# Patient Record
Sex: Female | Born: 1935 | Hispanic: No | Smoking: Current every day smoker
Health system: Southern US, Community
[De-identification: ages and names within clinical notes are randomized; demographics above are authoritative.]

## PROBLEM LIST (undated history)

## (undated) DIAGNOSIS — E785 Hyperlipidemia, unspecified: Secondary | ICD-10-CM

## (undated) DIAGNOSIS — T8859XA Other complications of anesthesia, initial encounter: Secondary | ICD-10-CM

## (undated) DIAGNOSIS — Z8709 Personal history of other diseases of the respiratory system: Secondary | ICD-10-CM

## (undated) DIAGNOSIS — H3530 Unspecified macular degeneration: Secondary | ICD-10-CM

## (undated) DIAGNOSIS — H548 Legal blindness, as defined in USA: Secondary | ICD-10-CM

## (undated) DIAGNOSIS — F419 Anxiety disorder, unspecified: Secondary | ICD-10-CM

## (undated) DIAGNOSIS — I10 Essential (primary) hypertension: Secondary | ICD-10-CM

## (undated) DIAGNOSIS — N393 Stress incontinence (female) (male): Secondary | ICD-10-CM

## (undated) DIAGNOSIS — T4145XA Adverse effect of unspecified anesthetic, initial encounter: Secondary | ICD-10-CM

## (undated) HISTORY — PX: TONSILLECTOMY: SUR1361

## (undated) HISTORY — PX: CLAVICLE SURGERY: SHX598

## (undated) HISTORY — PX: APPENDECTOMY: SHX54

---

## 2000-06-26 ENCOUNTER — Other Ambulatory Visit: Admission: RE | Admit: 2000-06-26 | Discharge: 2000-06-26 | Payer: Self-pay | Admitting: Obstetrics & Gynecology

## 2001-03-16 ENCOUNTER — Encounter: Payer: Self-pay | Admitting: Orthopedic Surgery

## 2001-03-16 ENCOUNTER — Encounter: Payer: Self-pay | Admitting: Emergency Medicine

## 2001-03-16 ENCOUNTER — Inpatient Hospital Stay (HOSPITAL_COMMUNITY): Admission: EM | Admit: 2001-03-16 | Discharge: 2001-03-18 | Payer: Self-pay | Admitting: Emergency Medicine

## 2001-03-18 ENCOUNTER — Inpatient Hospital Stay (HOSPITAL_COMMUNITY)
Admission: RE | Admit: 2001-03-18 | Discharge: 2001-03-22 | Payer: Self-pay | Admitting: Physical Medicine & Rehabilitation

## 2001-03-20 ENCOUNTER — Encounter: Payer: Self-pay | Admitting: Physical Medicine & Rehabilitation

## 2001-09-12 ENCOUNTER — Encounter: Admission: RE | Admit: 2001-09-12 | Discharge: 2001-09-12 | Payer: Self-pay | Admitting: Geriatric Medicine

## 2001-09-12 ENCOUNTER — Encounter: Payer: Self-pay | Admitting: Geriatric Medicine

## 2001-09-18 ENCOUNTER — Encounter: Payer: Self-pay | Admitting: Geriatric Medicine

## 2001-09-18 ENCOUNTER — Encounter: Admission: RE | Admit: 2001-09-18 | Discharge: 2001-09-18 | Payer: Self-pay | Admitting: Geriatric Medicine

## 2001-10-20 ENCOUNTER — Encounter: Admission: RE | Admit: 2001-10-20 | Discharge: 2001-10-20 | Payer: Self-pay | Admitting: Geriatric Medicine

## 2001-10-20 ENCOUNTER — Encounter: Payer: Self-pay | Admitting: Geriatric Medicine

## 2002-10-14 ENCOUNTER — Encounter: Payer: Self-pay | Admitting: Geriatric Medicine

## 2002-10-14 ENCOUNTER — Encounter: Admission: RE | Admit: 2002-10-14 | Discharge: 2002-10-14 | Payer: Self-pay | Admitting: Geriatric Medicine

## 2004-01-30 HISTORY — PX: OTHER SURGICAL HISTORY: SHX169

## 2010-02-20 ENCOUNTER — Encounter: Payer: Self-pay | Admitting: Geriatric Medicine

## 2010-10-04 ENCOUNTER — Encounter (INDEPENDENT_AMBULATORY_CARE_PROVIDER_SITE_OTHER): Payer: Medicare Other | Admitting: Ophthalmology

## 2010-10-04 DIAGNOSIS — H2510 Age-related nuclear cataract, unspecified eye: Secondary | ICD-10-CM

## 2010-10-04 DIAGNOSIS — H353290 Exudative age-related macular degeneration, unspecified eye, stage unspecified: Secondary | ICD-10-CM

## 2010-10-04 DIAGNOSIS — H43819 Vitreous degeneration, unspecified eye: Secondary | ICD-10-CM

## 2010-10-04 DIAGNOSIS — H3530 Unspecified macular degeneration: Secondary | ICD-10-CM

## 2010-12-13 ENCOUNTER — Encounter (INDEPENDENT_AMBULATORY_CARE_PROVIDER_SITE_OTHER): Payer: Medicare Other | Admitting: Ophthalmology

## 2010-12-13 DIAGNOSIS — H353290 Exudative age-related macular degeneration, unspecified eye, stage unspecified: Secondary | ICD-10-CM

## 2010-12-13 DIAGNOSIS — H2510 Age-related nuclear cataract, unspecified eye: Secondary | ICD-10-CM

## 2010-12-13 DIAGNOSIS — H43819 Vitreous degeneration, unspecified eye: Secondary | ICD-10-CM

## 2010-12-13 DIAGNOSIS — H3530 Unspecified macular degeneration: Secondary | ICD-10-CM

## 2011-02-27 DIAGNOSIS — Z09 Encounter for follow-up examination after completed treatment for conditions other than malignant neoplasm: Secondary | ICD-10-CM | POA: Diagnosis not present

## 2011-02-27 DIAGNOSIS — Z79899 Other long term (current) drug therapy: Secondary | ICD-10-CM | POA: Diagnosis not present

## 2011-03-09 DIAGNOSIS — I129 Hypertensive chronic kidney disease with stage 1 through stage 4 chronic kidney disease, or unspecified chronic kidney disease: Secondary | ICD-10-CM | POA: Diagnosis not present

## 2011-03-09 DIAGNOSIS — Z23 Encounter for immunization: Secondary | ICD-10-CM | POA: Diagnosis not present

## 2011-03-09 DIAGNOSIS — Z0000 Encounter for general adult medical examination without abnormal findings: Secondary | ICD-10-CM | POA: Diagnosis not present

## 2011-03-09 DIAGNOSIS — N183 Chronic kidney disease, stage 3 unspecified: Secondary | ICD-10-CM | POA: Diagnosis not present

## 2011-03-14 ENCOUNTER — Encounter (INDEPENDENT_AMBULATORY_CARE_PROVIDER_SITE_OTHER): Payer: Medicare Other | Admitting: Ophthalmology

## 2011-03-14 DIAGNOSIS — H43819 Vitreous degeneration, unspecified eye: Secondary | ICD-10-CM

## 2011-03-14 DIAGNOSIS — H3530 Unspecified macular degeneration: Secondary | ICD-10-CM

## 2011-03-14 DIAGNOSIS — H353290 Exudative age-related macular degeneration, unspecified eye, stage unspecified: Secondary | ICD-10-CM

## 2011-03-14 DIAGNOSIS — H2510 Age-related nuclear cataract, unspecified eye: Secondary | ICD-10-CM | POA: Diagnosis not present

## 2011-03-16 DIAGNOSIS — C44319 Basal cell carcinoma of skin of other parts of face: Secondary | ICD-10-CM | POA: Diagnosis not present

## 2011-03-16 DIAGNOSIS — L980 Pyogenic granuloma: Secondary | ICD-10-CM | POA: Diagnosis not present

## 2011-03-16 DIAGNOSIS — L570 Actinic keratosis: Secondary | ICD-10-CM | POA: Diagnosis not present

## 2011-03-30 DIAGNOSIS — H2510 Age-related nuclear cataract, unspecified eye: Secondary | ICD-10-CM | POA: Diagnosis not present

## 2011-06-24 ENCOUNTER — Encounter (HOSPITAL_COMMUNITY): Payer: Self-pay | Admitting: *Deleted

## 2011-06-24 ENCOUNTER — Emergency Department (HOSPITAL_COMMUNITY)
Admission: EM | Admit: 2011-06-24 | Discharge: 2011-06-24 | Disposition: A | Discharge status: Home / Self Care | Payer: Medicare Other | Attending: Emergency Medicine | Admitting: Emergency Medicine

## 2011-06-24 ENCOUNTER — Emergency Department (HOSPITAL_COMMUNITY): Payer: Medicare Other

## 2011-06-24 DIAGNOSIS — R002 Palpitations: Secondary | ICD-10-CM | POA: Insufficient documentation

## 2011-06-24 DIAGNOSIS — R42 Dizziness and giddiness: Secondary | ICD-10-CM | POA: Diagnosis not present

## 2011-06-24 DIAGNOSIS — R110 Nausea: Secondary | ICD-10-CM | POA: Insufficient documentation

## 2011-06-24 DIAGNOSIS — R209 Unspecified disturbances of skin sensation: Secondary | ICD-10-CM | POA: Diagnosis not present

## 2011-06-24 DIAGNOSIS — I10 Essential (primary) hypertension: Secondary | ICD-10-CM | POA: Insufficient documentation

## 2011-06-24 DIAGNOSIS — R51 Headache: Secondary | ICD-10-CM | POA: Diagnosis not present

## 2011-06-24 DIAGNOSIS — R159 Full incontinence of feces: Secondary | ICD-10-CM | POA: Diagnosis not present

## 2011-06-24 DIAGNOSIS — R61 Generalized hyperhidrosis: Secondary | ICD-10-CM | POA: Insufficient documentation

## 2011-06-24 DIAGNOSIS — R55 Syncope and collapse: Secondary | ICD-10-CM | POA: Diagnosis not present

## 2011-06-24 DIAGNOSIS — G319 Degenerative disease of nervous system, unspecified: Secondary | ICD-10-CM | POA: Diagnosis not present

## 2011-06-24 DIAGNOSIS — R32 Unspecified urinary incontinence: Secondary | ICD-10-CM | POA: Insufficient documentation

## 2011-06-24 DIAGNOSIS — J322 Chronic ethmoidal sinusitis: Secondary | ICD-10-CM | POA: Diagnosis not present

## 2011-06-24 HISTORY — DX: Unspecified macular degeneration: H35.30

## 2011-06-24 HISTORY — DX: Essential (primary) hypertension: I10

## 2011-06-24 LAB — COMPREHENSIVE METABOLIC PANEL
ALT: 15 U/L (ref 0–35)
AST: 22 U/L (ref 0–37)
Albumin: 3.8 g/dL (ref 3.5–5.2)
Alkaline Phosphatase: 52 U/L (ref 39–117)
BUN: 22 mg/dL (ref 6–23)
CO2: 25 mEq/L (ref 19–32)
Calcium: 9.9 mg/dL (ref 8.4–10.5)
Chloride: 104 mEq/L (ref 96–112)
Creatinine, Ser: 1.17 mg/dL — ABNORMAL HIGH (ref 0.50–1.10)
GFR calc Af Amer: 47 mL/min — ABNORMAL LOW (ref >90)
GFR calc non Af Amer: 40 mL/min — ABNORMAL LOW (ref >90)
Glucose, Bld: 103 mg/dL — ABNORMAL HIGH (ref 70–99)
Potassium: 3.5 mEq/L (ref 3.5–5.1)
Sodium: 142 mEq/L (ref 135–145)
Total Bilirubin: 0.6 mg/dL (ref 0.3–1.2)
Total Protein: 6.6 g/dL (ref 6.0–8.3)

## 2011-06-24 LAB — DIFFERENTIAL
Basophils Absolute: 0 10*3/uL (ref 0.0–0.1)
Basophils Relative: 1 % (ref 0–1)
Eosinophils Absolute: 0.2 10*3/uL (ref 0.0–0.7)
Eosinophils Relative: 2 % (ref 0–5)
Lymphocytes Relative: 23 % (ref 12–46)
Lymphs Abs: 1.9 10*3/uL (ref 0.7–4.0)
Monocytes Absolute: 0.8 10*3/uL (ref 0.1–1.0)
Monocytes Relative: 9 % (ref 3–12)
Neutro Abs: 5.4 10*3/uL (ref 1.7–7.7)
Neutrophils Relative %: 65 % (ref 43–77)

## 2011-06-24 LAB — CBC
HCT: 40.1 % (ref 36.0–46.0)
Hemoglobin: 13.1 g/dL (ref 12.0–15.0)
MCH: 28.4 pg (ref 26.0–34.0)
MCHC: 32.7 g/dL (ref 30.0–36.0)
MCV: 86.8 fL (ref 78.0–100.0)
Platelets: 241 10*3/uL (ref 150–400)
RBC: 4.62 MIL/uL (ref 3.87–5.11)
RDW: 14.1 % (ref 11.5–15.5)
WBC: 8.2 10*3/uL (ref 4.0–10.5)

## 2011-06-24 LAB — URINE MICROSCOPIC-ADD ON

## 2011-06-24 LAB — URINALYSIS, ROUTINE W REFLEX MICROSCOPIC
Bilirubin Urine: NEGATIVE
Glucose, UA: NEGATIVE mg/dL
Hgb urine dipstick: NEGATIVE
Ketones, ur: NEGATIVE mg/dL
Nitrite: NEGATIVE
Protein, ur: NEGATIVE mg/dL
Specific Gravity, Urine: 1.015 (ref 1.005–1.030)
Urobilinogen, UA: 0.2 mg/dL (ref 0.0–1.0)
pH: 7.5 (ref 5.0–8.0)

## 2011-06-24 LAB — POCT I-STAT TROPONIN I: Troponin i, poc: 0.01 ng/mL (ref 0.00–0.08)

## 2011-06-24 LAB — PROTIME-INR
INR: 0.96 (ref 0.00–1.49)
Prothrombin Time: 13 seconds (ref 11.6–15.2)

## 2011-06-24 LAB — APTT: aPTT: 28 seconds (ref 24–37)

## 2011-06-24 NOTE — ED Notes (Signed)
Pt ambulated to bathroom.  Tolerated well.  Steady gait.

## 2011-06-24 NOTE — ED Notes (Signed)
Pt reports having a <2 week continuous episode of 'head feeling funny', denies HA or pressure.  Pt reports increased weakness and one episode of bowel incontinence this am.  Denies fall.  Reports that she also had an episode of palpitations, with diaphoresis.  Denies N/V/SOB.  Non-tender on palpation of chest or abdomen.  Denies palpitations or pain at this time.  Family at bedside is concerned for increased anxiety.  Will continue to monitor.

## 2011-06-24 NOTE — ED Provider Notes (Cosign Needed)
History     CSN: [id number]  Arrival date & time 06/24/11  4098   First MD Initiated Contact with Patient 06/24/11 [phone]      Chief Complaint  Patient presents with  . Headache  . Palpitations    (Consider location/radiation/quality/duration/timing/severity/associated sxs/prior treatment) HPI Comments: Patient is an 78 year old woman who said that her head is been feeling strange recently. This morning she woke up with a fluttering in her chest. She went to the kitchen and was preparing breakfast. She took her morning medications. All of a sudden her head felt strange. He felt she had to lie down. She broke out in a profuse sweat, and on the way back to bed was incontinent of urine and stool. She had no prior similar episode. Her past history is noteworthy for hypertension and macular degeneration.  Patient is a 78 y.o. female presenting with neurologic complaint. The history is provided by the patient. No language interpreter was used.  Neurologic Problem The primary symptoms include dizziness and nausea. Primary symptoms do not include fever or vomiting. The symptoms began 2 to 6 hours ago. The episode lasted 30 minutes. The symptoms are improving. The neurological symptoms are diffuse.  Dizziness also occurs with nausea. Dizziness does not occur with vomiting.  Medical issues also include hypertension. Workup history includes MRI.    Past Medical History  Diagnosis Date  . Macular degeneration   . Hypertension   . UTI (urinary tract infection)     History reviewed. No pertinent past surgical history.  History reviewed. No pertinent family history.  History  Substance Use Topics  . Smoking status: Current Everyday Smoker  . Smokeless tobacco: Not on file  . Alcohol Use: Yes     occ    OB History    Grav Para Term Preterm Abortions TAB SAB Ect Mult Living                  Review of Systems  Constitutional: Negative.  Negative for fever and chills.  HENT: Negative.    Eyes: Negative.   Respiratory: Negative.   Cardiovascular: Positive for palpitations.  Gastrointestinal: Positive for nausea. Negative for vomiting.  Genitourinary:       Incontinence   Musculoskeletal: Negative.   Skin:       Profuse sweating.  Neurological: Positive for dizziness.  Psychiatric/Behavioral: Negative.     Allergies  Ivp dye and Sulfa antibiotics  Home Medications  No current outpatient prescriptions on file.  BP 123/52  Pulse 57  Temp(Src) 98.1 F (36.7 C) (Oral)  Resp 16  SpO2 96%  Physical Exam  Nursing note and vitals reviewed. Constitutional: She is oriented to person, place, and time.       Pleasant elderly lady in no distress.  HENT:  Head: Normocephalic and atraumatic.  Right Ear: External ear normal.  Left Ear: External ear normal.  Mouth/Throat: Oropharynx is clear and moist.  Eyes: Conjunctivae and EOM are normal. Pupils are equal, round, and reactive to light.  Neck: Normal range of motion. Neck supple.  Cardiovascular: Normal rate, regular rhythm and normal heart sounds.   Pulmonary/Chest: Effort normal and breath sounds normal.  Abdominal: Soft. Bowel sounds are normal.  Musculoskeletal: Normal range of motion. She exhibits no edema and no tenderness.  Neurological: She is alert and oriented to person, place, and time.       No sensory or motor deficit.  Skin: Skin is warm and dry.  Psychiatric: She has a normal mood and  affect. Her behavior is normal.    ED Course  Procedures (including critical care time)  9:58 AM  Date: 06/24/2011  Rate: 56  Rhythm: sinus bradycardia  QRS Axis: normal  Intervals: normal  ST/T Wave abnormalities: normal  Conduction Disutrbances:none  Narrative Interpretation: Normal EKG  Old EKG Reviewed: unchanged  11:07 AM Pt seen --> physical exam performed.  EKG benign.  Lab workup ordered.  2:54 PM Results for orders placed during the hospital encounter of 06/24/11  CBC      Component Value  Range   WBC 8.2  4.0 - 10.5 (K/uL)   RBC 4.62  3.87 - 5.11 (MIL/uL)   Hemoglobin 13.1  12.0 - 15.0 (g/dL)   HCT 91.4  78.2 - 95.6 (%)   MCV 86.8  78.0 - 100.0 (fL)   MCH 28.4  26.0 - 34.0 (pg)   MCHC 32.7  30.0 - 36.0 (g/dL)   RDW 21.3  08.6 - 57.8 (%)   Platelets 241  150 - 400 (K/uL)  DIFFERENTIAL      Component Value Range   Neutrophils Relative 65  43 - 77 (%)   Neutro Abs 5.4  1.7 - 7.7 (K/uL)   Lymphocytes Relative 23  12 - 46 (%)   Lymphs Abs 1.9  0.7 - 4.0 (K/uL)   Monocytes Relative 9  3 - 12 (%)   Monocytes Absolute 0.8  0.1 - 1.0 (K/uL)   Eosinophils Relative 2  0 - 5 (%)   Eosinophils Absolute 0.2  0.0 - 0.7 (K/uL)   Basophils Relative 1  0 - 1 (%)   Basophils Absolute 0.0  0.0 - 0.1 (K/uL)  COMPREHENSIVE METABOLIC PANEL      Component Value Range   Sodium 142  135 - 145 (mEq/L)   Potassium 3.5  3.5 - 5.1 (mEq/L)   Chloride 104  96 - 112 (mEq/L)   CO2 25  19 - 32 (mEq/L)   Glucose, Bld 103 (*) 70 - 99 (mg/dL)   BUN 22  6 - 23 (mg/dL)   Creatinine, Ser 4.69 (*) 0.50 - 1.10 (mg/dL)   Calcium 9.9  8.4 - 62.9 (mg/dL)   Total Protein 6.6  6.0 - 8.3 (g/dL)   Albumin 3.8  3.5 - 5.2 (g/dL)   AST 22  0 - 37 (U/L)   ALT 15  0 - 35 (U/L)   Alkaline Phosphatase 52  39 - 117 (U/L)   Total Bilirubin 0.6  0.3 - 1.2 (mg/dL)   GFR calc non Af Amer 40 (*) >90 (mL/min)   GFR calc Af Amer 47 (*) >90 (mL/min)  URINALYSIS, ROUTINE W REFLEX MICROSCOPIC      Component Value Range   Color, Urine YELLOW  YELLOW    APPearance CLEAR  CLEAR    Specific Gravity, Urine 1.015  1.005 - 1.030    pH 7.5  5.0 - 8.0    Glucose, UA NEGATIVE  NEGATIVE (mg/dL)   Hgb urine dipstick NEGATIVE  NEGATIVE    Bilirubin Urine NEGATIVE  NEGATIVE    Ketones, ur NEGATIVE  NEGATIVE (mg/dL)   Protein, ur NEGATIVE  NEGATIVE (mg/dL)   Urobilinogen, UA 0.2  0.0 - 1.0 (mg/dL)   Nitrite NEGATIVE  NEGATIVE    Leukocytes, UA TRACE (*) NEGATIVE   PROTIME-INR      Component Value Range   Prothrombin Time 13.0   11.6 - 15.2 (seconds)   INR 0.96  0.00 - 1.49   APTT  Component Value Range   aPTT 28  24 - 37 (seconds)  URINE MICROSCOPIC-ADD ON      Component Value Range   Squamous Epithelial / LPF MANY (*) RARE    WBC, UA 0-2  <3 (WBC/hpf)   Bacteria, UA MANY (*) RARE   POCT I-STAT TROPONIN I      Component Value Range   Troponin i, poc 0.01  0.00 - 0.08 (ng/mL)   Comment 3            Ct Head Wo Contrast  06/24/2011  *RADIOLOGY REPORT*  Clinical Data: Feeling funny.  Incontinence.  History of mini strokes.  CT HEAD WITHOUT CONTRAST  Technique:  Contiguous axial images were obtained from the base of the skull through the vertex without contrast.  Comparison: None  Findings: There is no intra or extra-axial fluid collection or mass lesion.  The basilar cisterns and ventricles have a normal appearance.  There is no CT evidence for acute infarction or hemorrhage.  Bone windows show no calvarial fracture.  There is mucoperiosteal thickening of the ethmoid sinuses.  Internal carotid calcification is present.  IMPRESSION:  1. No evidence for acute intracranial abnormality. 2.  Changes of chronic ethmoid sinusitis.  Original Report Authenticated By: Patterson Hammersmith, M.D.    Lab tests and CT of head negative.  MRI of brain ordered.  Results discussed with pt.    4:00 PM Pt is on MRI table now.  4:44 PM MRI of brain shows age-related change, no stroke or mass or hemorrhage.  Pt advised that her tests were good, and that it is safe to go home.  No specific cause of her symptoms was found, but no serious cause was found either.  1. Near syncope         Carleene Cooper III, MD 06/25/11 [phone]

## 2011-06-24 NOTE — ED Notes (Signed)
Family member at nurses desk.  Reports that Dr. Ignacia Palma told him one hour ago that pt was going to be discharged.  Reports that

## 2011-06-24 NOTE — ED Notes (Signed)
Reports her "head feeling funny for several days" thought it may be due to her bp. Woke up this am with headache and was having palpitations last night. Had episode this am of diaphoresis and was incontinent of her bowels. No neuro deficits noted at triage, ekg done. resp e/u. skn w/d.

## 2011-06-24 NOTE — Discharge Instructions (Signed)
Karen Calhoun, you had physical examination, electrocardiogram, laboratory tests, CT x-ray the brain, an MRI of the brain to check on you after you had an episode where her head felt funny, he became very sweaty and became incontinent. Fortunately, all your tests came out normal for a person your age. No particular explanation was found for this episode; on the other hand, none of this serious disease that can cause an episode like this was found. It is safe to go home and to continue your regular medications. A report of your visit will be sent to Merlene Laughter M.D., your internist.

## 2011-06-26 LAB — URINE CULTURE
Colony Count: >=100000
Culture  Setup Time: 201305261659

## 2011-06-27 ENCOUNTER — Encounter (INDEPENDENT_AMBULATORY_CARE_PROVIDER_SITE_OTHER): Payer: Medicare Other | Admitting: Ophthalmology

## 2011-06-27 DIAGNOSIS — H43819 Vitreous degeneration, unspecified eye: Secondary | ICD-10-CM

## 2011-06-27 DIAGNOSIS — H353290 Exudative age-related macular degeneration, unspecified eye, stage unspecified: Secondary | ICD-10-CM | POA: Diagnosis not present

## 2011-06-27 DIAGNOSIS — H2510 Age-related nuclear cataract, unspecified eye: Secondary | ICD-10-CM | POA: Diagnosis not present

## 2011-06-27 DIAGNOSIS — H3530 Unspecified macular degeneration: Secondary | ICD-10-CM

## 2011-06-27 NOTE — ED Notes (Signed)
+   Urine Chart sent to EDP office for review. 

## 2011-06-28 NOTE — ED Notes (Signed)
Prescription called in to [facility] at [phone] for keflex 500mg  po bid x7 days; pt informed.

## 2011-06-28 NOTE — ED Notes (Signed)
Chart returned from EDP office. Prescribed Keflex 500 BID x 7 days. Follow-up with PCP. Prescribed/reviewed by Jaci Carrel PA-C.

## 2011-06-28 NOTE — ED Notes (Signed)
Called patient and informed them of new medication. Wants RX called to [facility] on [address]. RX called in by Otilio Carpen P[facility].

## 2011-08-15 ENCOUNTER — Encounter (INDEPENDENT_AMBULATORY_CARE_PROVIDER_SITE_OTHER): Payer: Medicare Other | Admitting: Ophthalmology

## 2011-08-15 DIAGNOSIS — H2510 Age-related nuclear cataract, unspecified eye: Secondary | ICD-10-CM

## 2011-08-15 DIAGNOSIS — H43819 Vitreous degeneration, unspecified eye: Secondary | ICD-10-CM | POA: Diagnosis not present

## 2011-08-15 DIAGNOSIS — H3530 Unspecified macular degeneration: Secondary | ICD-10-CM | POA: Diagnosis not present

## 2011-08-24 DIAGNOSIS — R32 Unspecified urinary incontinence: Secondary | ICD-10-CM | POA: Diagnosis not present

## 2011-09-06 DIAGNOSIS — R5381 Other malaise: Secondary | ICD-10-CM | POA: Diagnosis not present

## 2011-09-06 DIAGNOSIS — I129 Hypertensive chronic kidney disease with stage 1 through stage 4 chronic kidney disease, or unspecified chronic kidney disease: Secondary | ICD-10-CM | POA: Diagnosis not present

## 2011-09-06 DIAGNOSIS — R5383 Other fatigue: Secondary | ICD-10-CM | POA: Diagnosis not present

## 2011-09-06 DIAGNOSIS — N390 Urinary tract infection, site not specified: Secondary | ICD-10-CM | POA: Diagnosis not present

## 2011-09-06 DIAGNOSIS — N183 Chronic kidney disease, stage 3 unspecified: Secondary | ICD-10-CM | POA: Diagnosis not present

## 2011-09-06 DIAGNOSIS — E782 Mixed hyperlipidemia: Secondary | ICD-10-CM | POA: Diagnosis not present

## 2011-09-18 DIAGNOSIS — H2510 Age-related nuclear cataract, unspecified eye: Secondary | ICD-10-CM | POA: Diagnosis not present

## 2011-09-18 DIAGNOSIS — H3530 Unspecified macular degeneration: Secondary | ICD-10-CM | POA: Diagnosis not present

## 2011-10-10 ENCOUNTER — Encounter (INDEPENDENT_AMBULATORY_CARE_PROVIDER_SITE_OTHER): Payer: Medicare Other | Admitting: Ophthalmology

## 2011-10-10 DIAGNOSIS — H3530 Unspecified macular degeneration: Secondary | ICD-10-CM

## 2011-10-10 DIAGNOSIS — H43819 Vitreous degeneration, unspecified eye: Secondary | ICD-10-CM | POA: Diagnosis not present

## 2011-10-10 DIAGNOSIS — I10 Essential (primary) hypertension: Secondary | ICD-10-CM

## 2011-10-10 DIAGNOSIS — H35039 Hypertensive retinopathy, unspecified eye: Secondary | ICD-10-CM | POA: Diagnosis not present

## 2011-10-10 DIAGNOSIS — H2510 Age-related nuclear cataract, unspecified eye: Secondary | ICD-10-CM

## 2011-10-17 DIAGNOSIS — E782 Mixed hyperlipidemia: Secondary | ICD-10-CM | POA: Diagnosis not present

## 2011-10-17 DIAGNOSIS — R5381 Other malaise: Secondary | ICD-10-CM | POA: Diagnosis not present

## 2011-10-17 DIAGNOSIS — N183 Chronic kidney disease, stage 3 unspecified: Secondary | ICD-10-CM | POA: Diagnosis not present

## 2011-10-17 DIAGNOSIS — I129 Hypertensive chronic kidney disease with stage 1 through stage 4 chronic kidney disease, or unspecified chronic kidney disease: Secondary | ICD-10-CM | POA: Diagnosis not present

## 2011-11-06 DIAGNOSIS — Z23 Encounter for immunization: Secondary | ICD-10-CM | POA: Diagnosis not present

## 2011-11-21 DIAGNOSIS — L570 Actinic keratosis: Secondary | ICD-10-CM | POA: Diagnosis not present

## 2011-11-26 ENCOUNTER — Emergency Department (INDEPENDENT_AMBULATORY_CARE_PROVIDER_SITE_OTHER)
Admission: EM | Admit: 2011-11-26 | Discharge: 2011-11-26 | Disposition: A | Discharge status: Home / Self Care | Payer: Medicare Other | Source: Home / Self Care

## 2011-11-26 ENCOUNTER — Emergency Department (INDEPENDENT_AMBULATORY_CARE_PROVIDER_SITE_OTHER): Payer: Medicare Other

## 2011-11-26 ENCOUNTER — Encounter (HOSPITAL_COMMUNITY): Payer: Self-pay | Admitting: Emergency Medicine

## 2011-11-26 DIAGNOSIS — IMO0002 Reserved for concepts with insufficient information to code with codable children: Secondary | ICD-10-CM | POA: Diagnosis not present

## 2011-11-26 DIAGNOSIS — S20219A Contusion of unspecified front wall of thorax, initial encounter: Secondary | ICD-10-CM

## 2011-11-26 DIAGNOSIS — W19XXXA Unspecified fall, initial encounter: Secondary | ICD-10-CM

## 2011-11-26 DIAGNOSIS — S00511A Abrasion of lip, initial encounter: Secondary | ICD-10-CM

## 2011-11-26 DIAGNOSIS — Y92009 Unspecified place in unspecified non-institutional (private) residence as the place of occurrence of the external cause: Secondary | ICD-10-CM

## 2011-11-26 DIAGNOSIS — J9819 Other pulmonary collapse: Secondary | ICD-10-CM | POA: Diagnosis not present

## 2011-11-26 DIAGNOSIS — R079 Chest pain, unspecified: Secondary | ICD-10-CM | POA: Diagnosis not present

## 2011-11-26 NOTE — ED Provider Notes (Signed)
History     CSN: [id number]  Arrival date & time 11/26/11  0948   None     Chief Complaint  Patient presents with  . Fall    (Consider location/radiation/quality/duration/timing/severity/associated sxs/prior treatment) HPI Comments: 78 year old female who was walking with a cane solely for the purpose of helping her not fall axially tripped over the cane last night. She fell forward and struck her face on the floor and also her anterior chest. She states that she had bleeding from the lip that lasted for a couple of hours. But since has stopped. She's complaining of pain in the right anterior and bilateral ribs. She denies injury to the head neck back abdomen pelvis, lower or upper extremities. She is fully alert and oriented with appropriate speech content and quite loquacious.  Patient is a 78 y.o. female presenting with fall.  Fall Pertinent negatives include no fever.    Past Medical History  Diagnosis Date  . Macular degeneration   . Hypertension   . UTI (urinary tract infection)   . High cholesterol     Past Surgical History  Procedure Date  . Tonsillectomy   . Appendectomy   . Ankle fracture surgery     No family history on file.  History  Substance Use Topics  . Smoking status: Current Every Day Smoker  . Smokeless tobacco: Not on file  . Alcohol Use: Yes     occ    OB History    Grav Para Term Preterm Abortions TAB SAB Ect Mult Living                  Review of Systems  Constitutional: Negative for fever, chills and activity change.  HENT: Positive for facial swelling, rhinorrhea and postnasal drip. Negative for ear pain, nosebleeds, congestion, drooling, trouble swallowing, neck pain, neck stiffness, dental problem, voice change and ear discharge.   Respiratory: Negative.  Negative for cough, choking, chest tightness, shortness of breath, wheezing and stridor.   Cardiovascular: Negative.   Gastrointestinal: Negative.   Genitourinary: Negative.     Musculoskeletal: Negative for back pain, joint swelling and arthralgias.       As per HPI  Skin: Positive for wound. Negative for color change, pallor and rash.  Neurological: Negative.   Psychiatric/Behavioral: Negative.     Allergies  Codeine; Ivp dye; Oxycodone hcl er; and Sulfa antibiotics  Home Medications   Current Outpatient Rx  Name Route Sig Dispense Refill  . ACETAMINOPHEN 325 MG PO TABS Oral Take 650 mg by mouth every 6 (six) hours as needed. Headache or pain    . ASPIRIN EC 81 MG PO TBEC Oral Take 81 mg by mouth daily.    . ATENOLOL 50 MG PO TABS Oral Take 50 mg by mouth daily.    . ATORVASTATIN CALCIUM 10 MG PO TABS Oral Take 10 mg by mouth daily.    Marland Kitchen CALCIUM CITRATE-VITAMIN D 315-200 MG-UNIT PO TABS Oral Take 1 tablet by mouth daily.    Marland Kitchen DIPHENHYDRAMINE-APAP (SLEEP) 25-500 MG PO TABS Oral Take 1 tablet by mouth at bedtime as needed. sleep    . HYDRALAZINE HCL 25 MG PO TABS Oral Take 25 mg by mouth 2 (two) times daily.    Marland Kitchen HYDROCHLOROTHIAZIDE 25 MG PO TABS Oral Take 25 mg by mouth daily.    Marland Kitchen MAGNESIUM 250 MG PO TABS Oral Take 1 tablet by mouth daily.    Marland Kitchen MOXIFLOXACIN HCL 0.5 % OP SOLN Both Eyes Place 1 drop  into both eyes 3 (three) times daily. For ophthalmologist appointment every approximately 3 months    . ADULT MULTIVITAMIN W/MINERALS CH Oral Take 1 tablet by mouth daily.    Marland Kitchen POTASSIUM CHLORIDE CRYS ER 20 MEQ PO TBCR Oral Take 20 mEq by mouth daily.    Marland Kitchen VITAMIN D (CHOLECALCIFEROL) PO Oral Take 1 tablet by mouth daily.      BP 168/66  Pulse 55  Temp 97.9 F (36.6 C) (Oral)  Resp 18  SpO2 96%  Physical Exam  Constitutional: She is oriented to person, place, and time. She appears well-developed and well-nourished. No distress.  HENT:  Head: Normocephalic and atraumatic.  Mouth/Throat: No oropharyngeal exudate.       TMs pearly gray transparent without hemotympanums, effusion, erythema, or retractions. OP is clear with exception of scant PND  Eyes: EOM  are normal. Pupils are equal, round, and reactive to light. Right eye exhibits no discharge. Left eye exhibits no discharge.  Neck: Normal range of motion. Neck supple.  Cardiovascular: Normal rate, regular rhythm and normal heart sounds.   Pulmonary/Chest: Effort normal and breath sounds normal. No respiratory distress. She has no wheezes.       There is tenderness over the right upper anterior chest wall bilateral ribs particularly the left costal margins.  Abdominal: Soft. She exhibits no distension. There is no tenderness.  Musculoskeletal: She exhibits tenderness. She exhibits no edema.       There is a superficial 2 mm abrasion to the upper mid lip. There are also abrasions beneath the upper at on the mucosal side. No lacerations. No dental tenderness at all her teeth are intact. The tongue, buccal wall is an oropharynx are otherwise intact. There is slight, minor bruising to the nose. No long bone tenderness or deformity. She is able to stand and walk with short steps and get herself onto the table without assistance.  Lymphadenopathy:    She has no cervical adenopathy.  Neurological: She is alert and oriented to person, place, and time. No cranial nerve deficit.  Skin: Skin is warm and dry.  Psychiatric: She has a normal mood and affect.    ED Course  Procedures (including critical care time)  Labs Reviewed - No data to display Dg Ribs Bilateral W/chest  11/26/2011  *RADIOLOGY REPORT*  Clinical Data: Fall, bilateral rib and chest pain.  BILATERAL RIBS AND CHEST - 4+ VIEW  Comparison: None.  Findings: Minimal left base atelectasis.  Right lung is clear.  No effusions.  Heart is normal size.  No acute bony abnormality.  No visible rib fracture or pneumothorax.  IMPRESSION: Left base atelectasis.   Original Report Authenticated By: Cyndie Chime, M.D.      1. Contusion of ribs   2. Fall at home   3. Lip abrasion       MDM  Ice to sore areas off and normal. Realized your  ribs will be sore for 2 to3 or more weeks. You may take Tylenol or may take ibuprofen 400 mg every 6 hours when necessary pain. The patient declined any pain for pain other than the ibuprofen. It was recommended she receive a T. dap but she declined.        Hayden Rasmussen, NP 11/26/11 1258

## 2011-11-26 NOTE — ED Notes (Signed)
Patient assisted into patient gown for physician examinaltion

## 2011-11-26 NOTE — ED Notes (Addendum)
Patient reported a fall yesterday.  Reports using a cane ambulating in living room, reports tripping on cane while ambulating .  Reports landing face first on a hard wood floor.  Upper lip with scrapes, swelling.  Also reports torso, lower rib cage pain that hurts with movement.   After standing up, patient also touches middle chest as very sore, hurst with cough or clearing of throat.

## 2011-11-27 NOTE — ED Provider Notes (Signed)
Medical screening examination/treatment/procedure(s) were performed by non-physician practitioner and as supervising physician I was immediately available for consultation/collaboration.  Leslee Home, M.D.   Reuben Likes, MD 11/27/11 [phone]

## 2011-11-28 DIAGNOSIS — R05 Cough: Secondary | ICD-10-CM | POA: Diagnosis not present

## 2011-11-28 DIAGNOSIS — R059 Cough, unspecified: Secondary | ICD-10-CM | POA: Diagnosis not present

## 2011-11-28 DIAGNOSIS — R079 Chest pain, unspecified: Secondary | ICD-10-CM | POA: Diagnosis not present

## 2011-12-10 ENCOUNTER — Encounter (INDEPENDENT_AMBULATORY_CARE_PROVIDER_SITE_OTHER): Payer: Medicare Other | Admitting: Ophthalmology

## 2011-12-10 DIAGNOSIS — H35039 Hypertensive retinopathy, unspecified eye: Secondary | ICD-10-CM

## 2011-12-10 DIAGNOSIS — H3530 Unspecified macular degeneration: Secondary | ICD-10-CM | POA: Diagnosis not present

## 2011-12-10 DIAGNOSIS — H353290 Exudative age-related macular degeneration, unspecified eye, stage unspecified: Secondary | ICD-10-CM

## 2011-12-10 DIAGNOSIS — H43819 Vitreous degeneration, unspecified eye: Secondary | ICD-10-CM

## 2011-12-10 DIAGNOSIS — H2510 Age-related nuclear cataract, unspecified eye: Secondary | ICD-10-CM

## 2011-12-10 DIAGNOSIS — I10 Essential (primary) hypertension: Secondary | ICD-10-CM | POA: Diagnosis not present

## 2012-02-06 ENCOUNTER — Encounter (INDEPENDENT_AMBULATORY_CARE_PROVIDER_SITE_OTHER): Payer: Medicare Other | Admitting: Ophthalmology

## 2012-02-06 DIAGNOSIS — H353290 Exudative age-related macular degeneration, unspecified eye, stage unspecified: Secondary | ICD-10-CM | POA: Diagnosis not present

## 2012-02-06 DIAGNOSIS — H2510 Age-related nuclear cataract, unspecified eye: Secondary | ICD-10-CM

## 2012-02-06 DIAGNOSIS — H35039 Hypertensive retinopathy, unspecified eye: Secondary | ICD-10-CM | POA: Diagnosis not present

## 2012-02-06 DIAGNOSIS — H3530 Unspecified macular degeneration: Secondary | ICD-10-CM

## 2012-02-06 DIAGNOSIS — H43819 Vitreous degeneration, unspecified eye: Secondary | ICD-10-CM

## 2012-02-06 DIAGNOSIS — I10 Essential (primary) hypertension: Secondary | ICD-10-CM | POA: Diagnosis not present

## 2012-03-21 DIAGNOSIS — Z0000 Encounter for general adult medical examination without abnormal findings: Secondary | ICD-10-CM | POA: Diagnosis not present

## 2012-03-21 DIAGNOSIS — I129 Hypertensive chronic kidney disease with stage 1 through stage 4 chronic kidney disease, or unspecified chronic kidney disease: Secondary | ICD-10-CM | POA: Diagnosis not present

## 2012-03-21 DIAGNOSIS — N183 Chronic kidney disease, stage 3 unspecified: Secondary | ICD-10-CM | POA: Diagnosis not present

## 2012-03-21 DIAGNOSIS — Z1331 Encounter for screening for depression: Secondary | ICD-10-CM | POA: Diagnosis not present

## 2012-04-03 DIAGNOSIS — H2510 Age-related nuclear cataract, unspecified eye: Secondary | ICD-10-CM | POA: Diagnosis not present

## 2012-04-16 ENCOUNTER — Encounter (INDEPENDENT_AMBULATORY_CARE_PROVIDER_SITE_OTHER): Payer: Medicare Other | Admitting: Ophthalmology

## 2012-04-21 ENCOUNTER — Encounter (INDEPENDENT_AMBULATORY_CARE_PROVIDER_SITE_OTHER): Payer: Medicare Other | Admitting: Ophthalmology

## 2012-04-21 DIAGNOSIS — H35039 Hypertensive retinopathy, unspecified eye: Secondary | ICD-10-CM | POA: Diagnosis not present

## 2012-04-21 DIAGNOSIS — H353290 Exudative age-related macular degeneration, unspecified eye, stage unspecified: Secondary | ICD-10-CM

## 2012-04-21 DIAGNOSIS — I10 Essential (primary) hypertension: Secondary | ICD-10-CM

## 2012-04-21 DIAGNOSIS — H2510 Age-related nuclear cataract, unspecified eye: Secondary | ICD-10-CM

## 2012-04-21 DIAGNOSIS — H3530 Unspecified macular degeneration: Secondary | ICD-10-CM

## 2012-04-21 DIAGNOSIS — H43819 Vitreous degeneration, unspecified eye: Secondary | ICD-10-CM

## 2012-05-22 ENCOUNTER — Encounter (INDEPENDENT_AMBULATORY_CARE_PROVIDER_SITE_OTHER): Payer: Medicare Other | Admitting: Ophthalmology

## 2012-05-22 DIAGNOSIS — H3530 Unspecified macular degeneration: Secondary | ICD-10-CM

## 2012-05-22 DIAGNOSIS — H35039 Hypertensive retinopathy, unspecified eye: Secondary | ICD-10-CM | POA: Diagnosis not present

## 2012-05-22 DIAGNOSIS — H353290 Exudative age-related macular degeneration, unspecified eye, stage unspecified: Secondary | ICD-10-CM | POA: Diagnosis not present

## 2012-05-22 DIAGNOSIS — H43819 Vitreous degeneration, unspecified eye: Secondary | ICD-10-CM

## 2012-05-22 DIAGNOSIS — I10 Essential (primary) hypertension: Secondary | ICD-10-CM

## 2012-06-17 DIAGNOSIS — E7800 Pure hypercholesterolemia, unspecified: Secondary | ICD-10-CM | POA: Diagnosis not present

## 2012-06-17 DIAGNOSIS — Z79899 Other long term (current) drug therapy: Secondary | ICD-10-CM | POA: Diagnosis not present

## 2012-07-03 ENCOUNTER — Encounter (INDEPENDENT_AMBULATORY_CARE_PROVIDER_SITE_OTHER): Payer: Medicare Other | Admitting: Ophthalmology

## 2012-07-03 DIAGNOSIS — H353290 Exudative age-related macular degeneration, unspecified eye, stage unspecified: Secondary | ICD-10-CM | POA: Diagnosis not present

## 2012-07-03 DIAGNOSIS — H3530 Unspecified macular degeneration: Secondary | ICD-10-CM | POA: Diagnosis not present

## 2012-07-03 DIAGNOSIS — H35039 Hypertensive retinopathy, unspecified eye: Secondary | ICD-10-CM

## 2012-07-03 DIAGNOSIS — H43819 Vitreous degeneration, unspecified eye: Secondary | ICD-10-CM

## 2012-07-03 DIAGNOSIS — H2510 Age-related nuclear cataract, unspecified eye: Secondary | ICD-10-CM

## 2012-07-03 DIAGNOSIS — I10 Essential (primary) hypertension: Secondary | ICD-10-CM

## 2012-09-04 ENCOUNTER — Encounter (INDEPENDENT_AMBULATORY_CARE_PROVIDER_SITE_OTHER): Payer: Medicare Other | Admitting: Ophthalmology

## 2012-09-04 DIAGNOSIS — H3530 Unspecified macular degeneration: Secondary | ICD-10-CM | POA: Diagnosis not present

## 2012-09-04 DIAGNOSIS — I10 Essential (primary) hypertension: Secondary | ICD-10-CM

## 2012-09-04 DIAGNOSIS — H43819 Vitreous degeneration, unspecified eye: Secondary | ICD-10-CM

## 2012-09-04 DIAGNOSIS — H35039 Hypertensive retinopathy, unspecified eye: Secondary | ICD-10-CM

## 2012-09-04 DIAGNOSIS — H353290 Exudative age-related macular degeneration, unspecified eye, stage unspecified: Secondary | ICD-10-CM

## 2012-09-04 DIAGNOSIS — H2510 Age-related nuclear cataract, unspecified eye: Secondary | ICD-10-CM

## 2012-09-19 DIAGNOSIS — I10 Essential (primary) hypertension: Secondary | ICD-10-CM | POA: Diagnosis not present

## 2012-09-19 DIAGNOSIS — R32 Unspecified urinary incontinence: Secondary | ICD-10-CM | POA: Diagnosis not present

## 2012-09-19 DIAGNOSIS — F17200 Nicotine dependence, unspecified, uncomplicated: Secondary | ICD-10-CM | POA: Diagnosis not present

## 2012-10-15 DIAGNOSIS — H2510 Age-related nuclear cataract, unspecified eye: Secondary | ICD-10-CM | POA: Diagnosis not present

## 2012-11-02 DIAGNOSIS — Z23 Encounter for immunization: Secondary | ICD-10-CM | POA: Diagnosis not present

## 2012-11-06 ENCOUNTER — Encounter (INDEPENDENT_AMBULATORY_CARE_PROVIDER_SITE_OTHER): Payer: Medicare Other | Admitting: Ophthalmology

## 2012-11-06 DIAGNOSIS — H353290 Exudative age-related macular degeneration, unspecified eye, stage unspecified: Secondary | ICD-10-CM

## 2012-11-06 DIAGNOSIS — I10 Essential (primary) hypertension: Secondary | ICD-10-CM | POA: Diagnosis not present

## 2012-11-06 DIAGNOSIS — H35039 Hypertensive retinopathy, unspecified eye: Secondary | ICD-10-CM

## 2012-11-06 DIAGNOSIS — H43819 Vitreous degeneration, unspecified eye: Secondary | ICD-10-CM

## 2012-11-06 DIAGNOSIS — H3530 Unspecified macular degeneration: Secondary | ICD-10-CM | POA: Diagnosis not present

## 2012-11-06 DIAGNOSIS — H2510 Age-related nuclear cataract, unspecified eye: Secondary | ICD-10-CM

## 2013-01-14 ENCOUNTER — Encounter (INDEPENDENT_AMBULATORY_CARE_PROVIDER_SITE_OTHER): Payer: Medicare Other | Admitting: Ophthalmology

## 2013-01-14 DIAGNOSIS — H43819 Vitreous degeneration, unspecified eye: Secondary | ICD-10-CM

## 2013-01-14 DIAGNOSIS — H3530 Unspecified macular degeneration: Secondary | ICD-10-CM | POA: Diagnosis not present

## 2013-01-14 DIAGNOSIS — H35039 Hypertensive retinopathy, unspecified eye: Secondary | ICD-10-CM

## 2013-01-14 DIAGNOSIS — H353290 Exudative age-related macular degeneration, unspecified eye, stage unspecified: Secondary | ICD-10-CM | POA: Diagnosis not present

## 2013-01-14 DIAGNOSIS — I10 Essential (primary) hypertension: Secondary | ICD-10-CM

## 2013-01-14 DIAGNOSIS — H2510 Age-related nuclear cataract, unspecified eye: Secondary | ICD-10-CM

## 2013-01-16 ENCOUNTER — Other Ambulatory Visit: Payer: Self-pay | Admitting: Geriatric Medicine

## 2013-01-16 ENCOUNTER — Ambulatory Visit
Admission: RE | Admit: 2013-01-16 | Discharge: 2013-01-16 | Disposition: A | Discharge status: Home / Self Care | Payer: Medicare Other | Source: Ambulatory Visit | Attending: Geriatric Medicine | Admitting: Geriatric Medicine

## 2013-01-16 DIAGNOSIS — M542 Cervicalgia: Secondary | ICD-10-CM

## 2013-01-16 DIAGNOSIS — M47812 Spondylosis without myelopathy or radiculopathy, cervical region: Secondary | ICD-10-CM | POA: Diagnosis not present

## 2013-01-28 DIAGNOSIS — M6530 Trigger finger, unspecified finger: Secondary | ICD-10-CM | POA: Diagnosis not present

## 2013-02-27 DIAGNOSIS — M6530 Trigger finger, unspecified finger: Secondary | ICD-10-CM | POA: Diagnosis not present

## 2013-03-23 DIAGNOSIS — E782 Mixed hyperlipidemia: Secondary | ICD-10-CM | POA: Diagnosis not present

## 2013-03-23 DIAGNOSIS — I129 Hypertensive chronic kidney disease with stage 1 through stage 4 chronic kidney disease, or unspecified chronic kidney disease: Secondary | ICD-10-CM | POA: Diagnosis not present

## 2013-03-23 DIAGNOSIS — N183 Chronic kidney disease, stage 3 unspecified: Secondary | ICD-10-CM | POA: Diagnosis not present

## 2013-03-23 DIAGNOSIS — Z0000 Encounter for general adult medical examination without abnormal findings: Secondary | ICD-10-CM | POA: Diagnosis not present

## 2013-03-23 DIAGNOSIS — Z1331 Encounter for screening for depression: Secondary | ICD-10-CM | POA: Diagnosis not present

## 2013-04-22 ENCOUNTER — Encounter (INDEPENDENT_AMBULATORY_CARE_PROVIDER_SITE_OTHER): Payer: Medicare Other | Admitting: Ophthalmology

## 2013-04-22 DIAGNOSIS — H35039 Hypertensive retinopathy, unspecified eye: Secondary | ICD-10-CM | POA: Diagnosis not present

## 2013-04-22 DIAGNOSIS — H353290 Exudative age-related macular degeneration, unspecified eye, stage unspecified: Secondary | ICD-10-CM | POA: Diagnosis not present

## 2013-04-22 DIAGNOSIS — H3530 Unspecified macular degeneration: Secondary | ICD-10-CM

## 2013-04-22 DIAGNOSIS — H43819 Vitreous degeneration, unspecified eye: Secondary | ICD-10-CM

## 2013-04-22 DIAGNOSIS — I10 Essential (primary) hypertension: Secondary | ICD-10-CM | POA: Diagnosis not present

## 2013-04-22 DIAGNOSIS — H2510 Age-related nuclear cataract, unspecified eye: Secondary | ICD-10-CM

## 2013-05-15 DIAGNOSIS — H3530 Unspecified macular degeneration: Secondary | ICD-10-CM | POA: Diagnosis not present

## 2013-05-15 DIAGNOSIS — H2510 Age-related nuclear cataract, unspecified eye: Secondary | ICD-10-CM | POA: Diagnosis not present

## 2013-06-08 DIAGNOSIS — E7800 Pure hypercholesterolemia, unspecified: Secondary | ICD-10-CM | POA: Diagnosis not present

## 2013-06-08 DIAGNOSIS — N390 Urinary tract infection, site not specified: Secondary | ICD-10-CM | POA: Diagnosis not present

## 2013-06-08 DIAGNOSIS — R002 Palpitations: Secondary | ICD-10-CM | POA: Diagnosis not present

## 2013-06-12 ENCOUNTER — Ambulatory Visit
Admission: RE | Admit: 2013-06-12 | Discharge: 2013-06-12 | Disposition: A | Discharge status: Home / Self Care | Payer: Medicare Other | Source: Ambulatory Visit | Attending: Nurse Practitioner | Admitting: Nurse Practitioner

## 2013-06-12 ENCOUNTER — Other Ambulatory Visit: Payer: Self-pay | Admitting: Nurse Practitioner

## 2013-06-12 DIAGNOSIS — K2990 Gastroduodenitis, unspecified, without bleeding: Principal | ICD-10-CM

## 2013-06-12 DIAGNOSIS — K2970 Gastritis, unspecified, without bleeding: Secondary | ICD-10-CM

## 2013-07-04 DIAGNOSIS — R300 Dysuria: Secondary | ICD-10-CM | POA: Diagnosis not present

## 2013-07-15 DIAGNOSIS — M6530 Trigger finger, unspecified finger: Secondary | ICD-10-CM | POA: Diagnosis not present

## 2013-08-19 ENCOUNTER — Encounter (INDEPENDENT_AMBULATORY_CARE_PROVIDER_SITE_OTHER): Payer: Medicare Other | Admitting: Ophthalmology

## 2013-08-19 DIAGNOSIS — H3530 Unspecified macular degeneration: Secondary | ICD-10-CM | POA: Diagnosis not present

## 2013-08-19 DIAGNOSIS — H353290 Exudative age-related macular degeneration, unspecified eye, stage unspecified: Secondary | ICD-10-CM | POA: Diagnosis not present

## 2013-08-19 DIAGNOSIS — I10 Essential (primary) hypertension: Secondary | ICD-10-CM | POA: Diagnosis not present

## 2013-08-19 DIAGNOSIS — H35039 Hypertensive retinopathy, unspecified eye: Secondary | ICD-10-CM

## 2013-08-19 DIAGNOSIS — H43819 Vitreous degeneration, unspecified eye: Secondary | ICD-10-CM

## 2013-09-13 IMAGING — CT CT HEAD W/O CM
1 series · 16 of 30 positions shown, 20 images · non-contrast
Comparison: None

CLINICAL DATA: Feeling funny.  Incontinence.  History of mini
strokes.

CT HEAD WITHOUT CONTRAST
TECHNIQUE: Contiguous axial images were obtained from the base of
the skull through the vertex without contrast.

[Series 2: head trauma 4.8 h37s · axial · 0.46mm/px · z∈[-95,+36]mm · 16 of 30 slices shown, 20 images]
[im 2/30  brain]
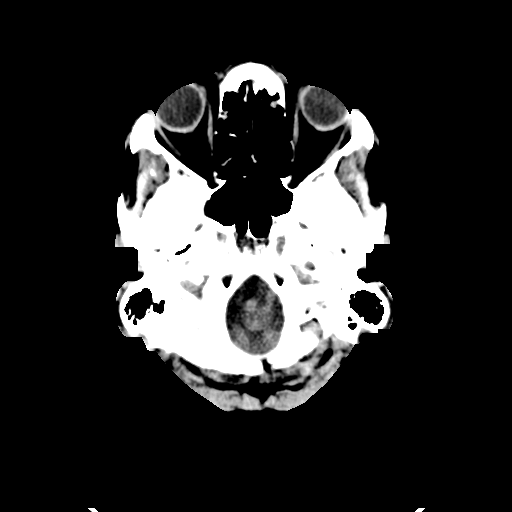
[im 2/30  bone]
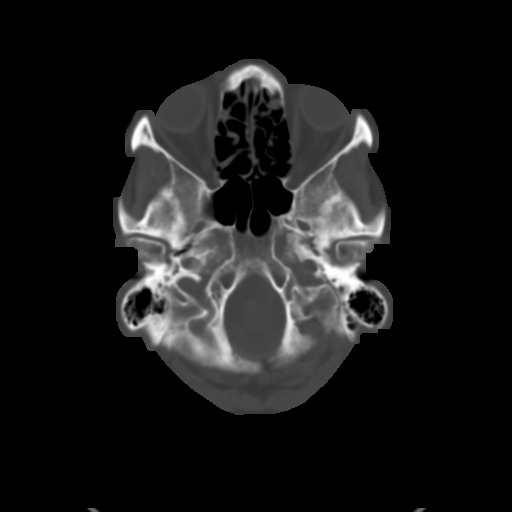
[im 4/30  brain]
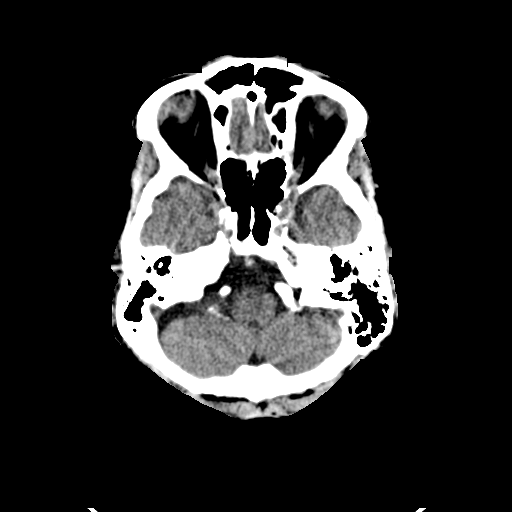
[im 6/30  brain]
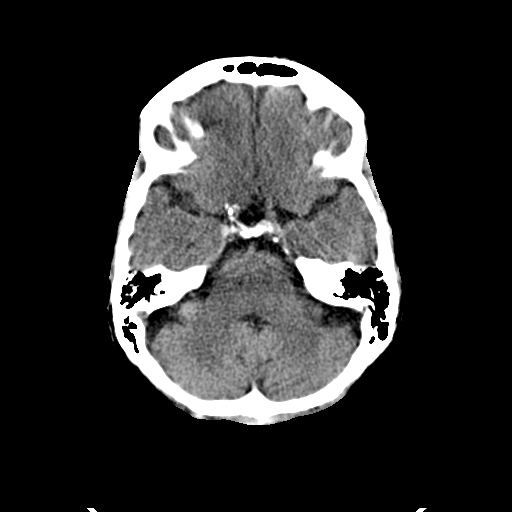
[im 8/30  brain]
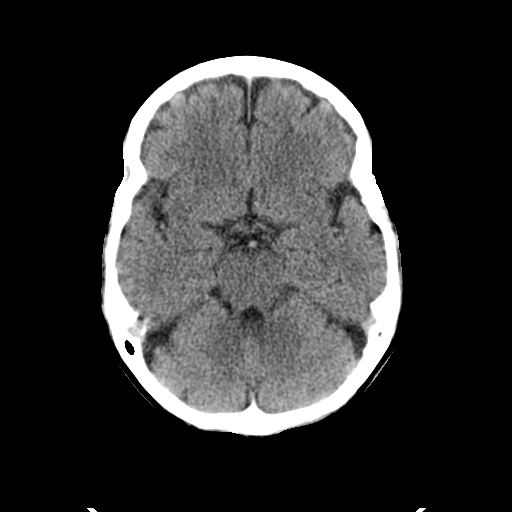
[im 9/30  brain]
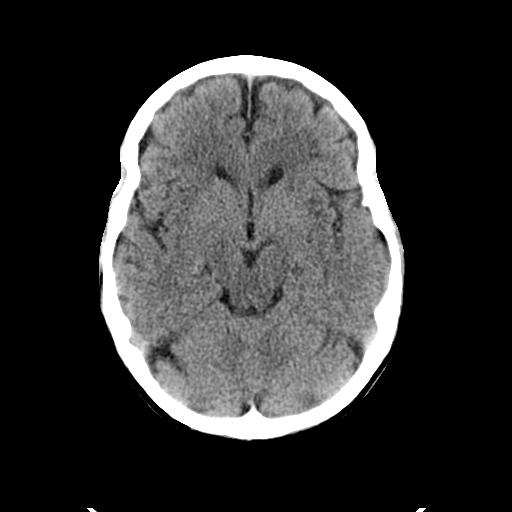
[im 9/30  bone]
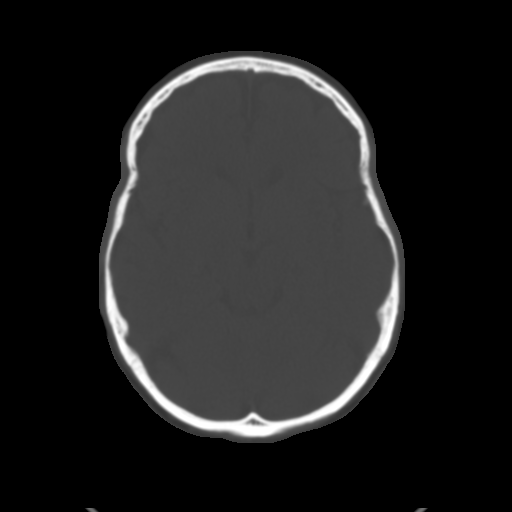
[im 11/30  brain]
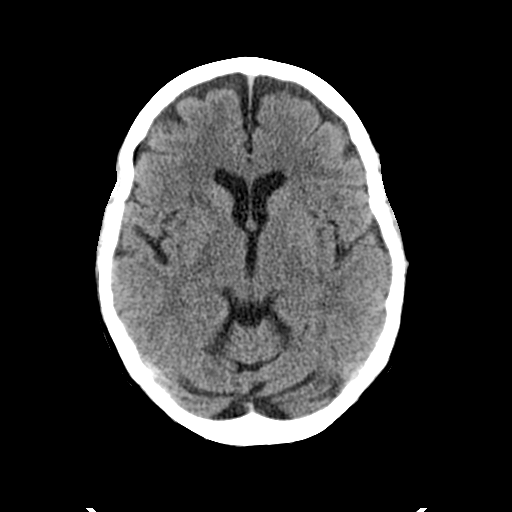
[im 13/30  brain]
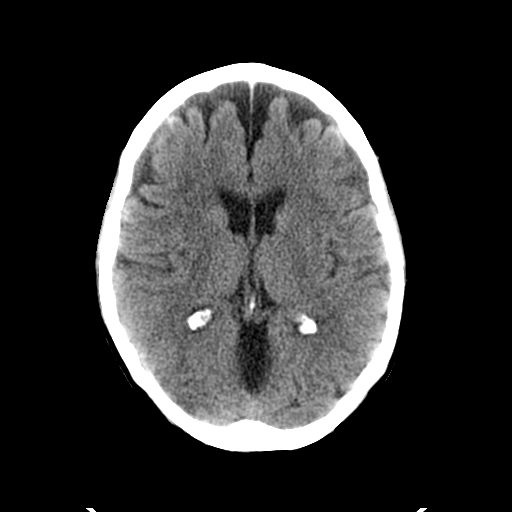
[im 15/30  brain]
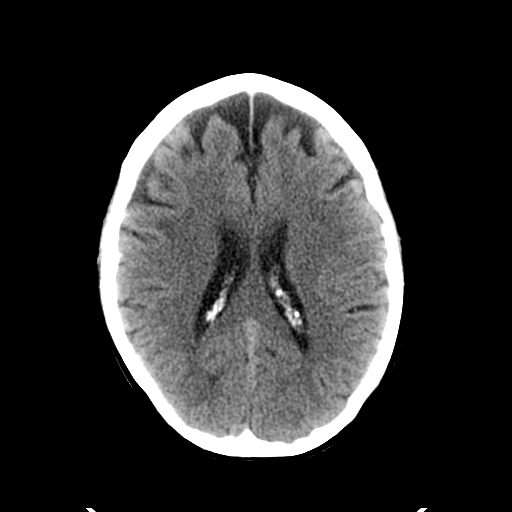
[im 16/30  brain]
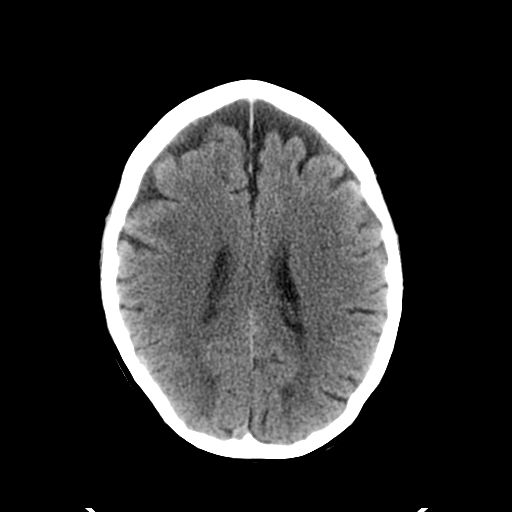
[im 16/30  bone]
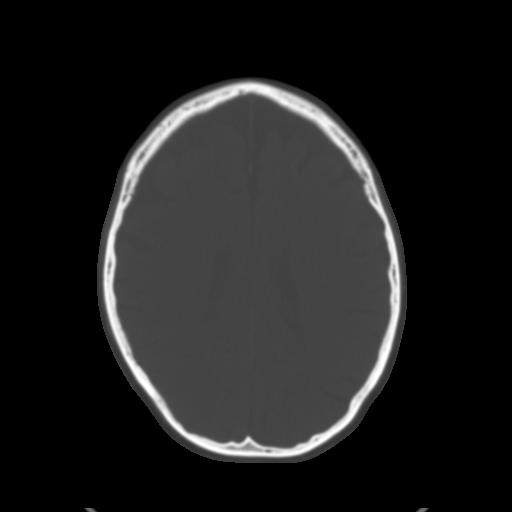
[im 18/30  brain]
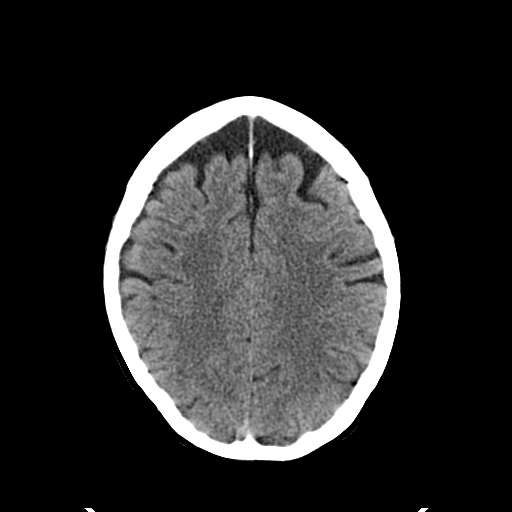
[im 20/30  brain]
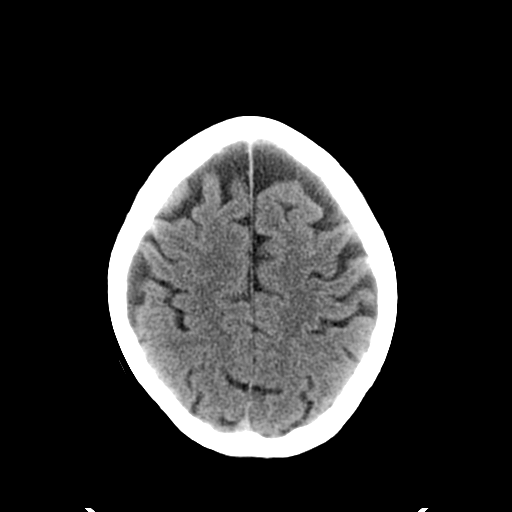
[im 22/30  brain]
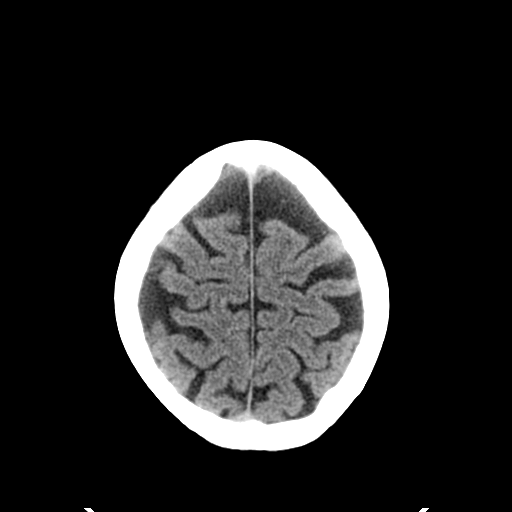
[im 23/30  brain]
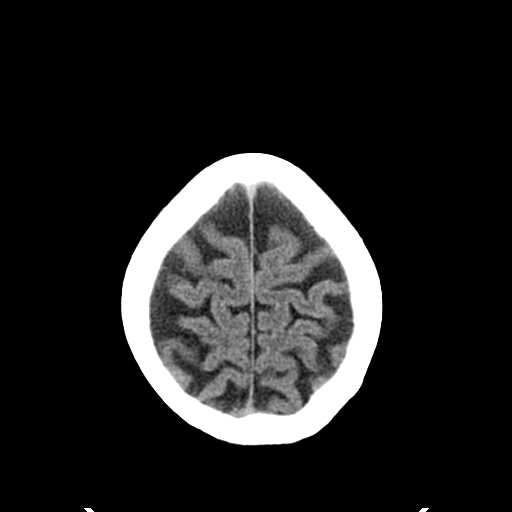
[im 23/30  bone]
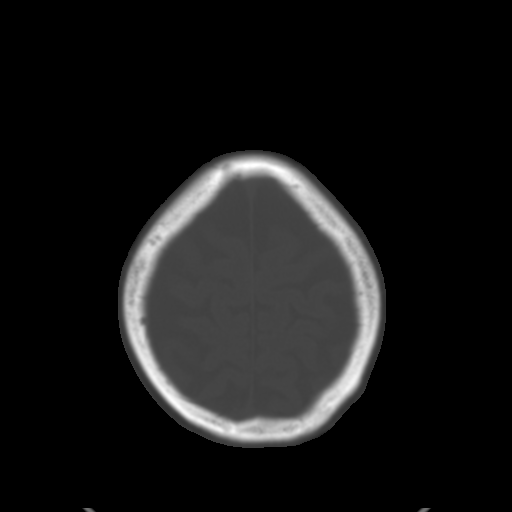
[im 25/30  brain]
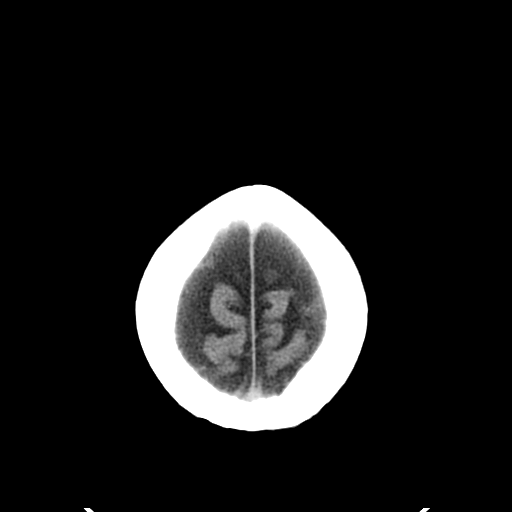
[im 27/30  brain]
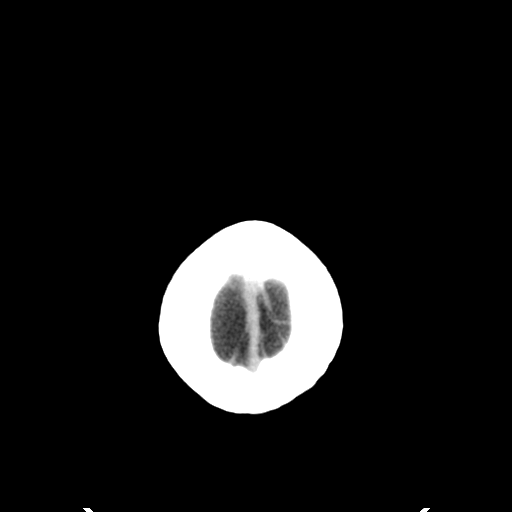
[im 29/30  brain]
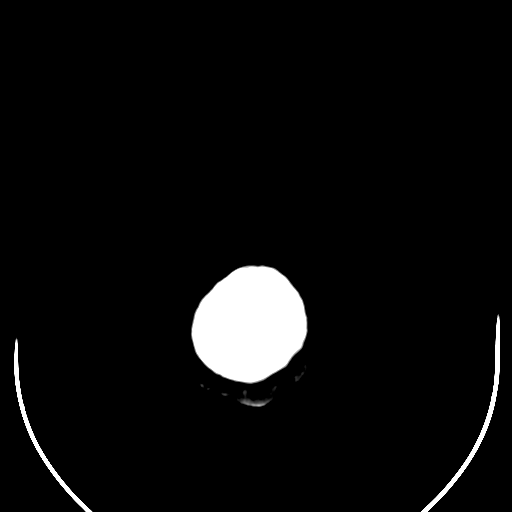

[16 of 30 positions shown; findings below may reference images not displayed]

FINDINGS: There is no intra or extra-axial fluid collection or mass
lesion.  The basilar cisterns and ventricles have a normal
appearance.  There is no CT evidence for acute infarction or
hemorrhage.

Bone windows show no calvarial fracture.  There is mucoperiosteal
thickening of the ethmoid sinuses.  Internal carotid calcification
is present.
IMPRESSION: 1. No evidence for acute intracranial abnormality.
2.  Changes of chronic ethmoid sinusitis.

## 2013-09-15 DIAGNOSIS — I129 Hypertensive chronic kidney disease with stage 1 through stage 4 chronic kidney disease, or unspecified chronic kidney disease: Secondary | ICD-10-CM | POA: Diagnosis not present

## 2013-09-15 DIAGNOSIS — R151 Fecal smearing: Secondary | ICD-10-CM | POA: Diagnosis not present

## 2013-09-15 DIAGNOSIS — E7800 Pure hypercholesterolemia, unspecified: Secondary | ICD-10-CM | POA: Diagnosis not present

## 2013-09-15 DIAGNOSIS — N183 Chronic kidney disease, stage 3 unspecified: Secondary | ICD-10-CM | POA: Diagnosis not present

## 2013-09-15 DIAGNOSIS — R300 Dysuria: Secondary | ICD-10-CM | POA: Diagnosis not present

## 2013-10-16 DIAGNOSIS — Z23 Encounter for immunization: Secondary | ICD-10-CM | POA: Diagnosis not present

## 2013-12-02 DIAGNOSIS — R3912 Poor urinary stream: Secondary | ICD-10-CM | POA: Diagnosis not present

## 2013-12-02 DIAGNOSIS — N3941 Urge incontinence: Secondary | ICD-10-CM | POA: Diagnosis not present

## 2013-12-02 DIAGNOSIS — N3020 Other chronic cystitis without hematuria: Secondary | ICD-10-CM | POA: Diagnosis not present

## 2013-12-30 ENCOUNTER — Encounter (INDEPENDENT_AMBULATORY_CARE_PROVIDER_SITE_OTHER): Payer: Medicare Other | Admitting: Ophthalmology

## 2013-12-30 DIAGNOSIS — H3531 Nonexudative age-related macular degeneration: Secondary | ICD-10-CM | POA: Diagnosis not present

## 2013-12-30 DIAGNOSIS — H43813 Vitreous degeneration, bilateral: Secondary | ICD-10-CM | POA: Diagnosis not present

## 2013-12-30 DIAGNOSIS — H35033 Hypertensive retinopathy, bilateral: Secondary | ICD-10-CM | POA: Diagnosis not present

## 2013-12-30 DIAGNOSIS — I10 Essential (primary) hypertension: Secondary | ICD-10-CM | POA: Diagnosis not present

## 2013-12-30 DIAGNOSIS — H3532 Exudative age-related macular degeneration: Secondary | ICD-10-CM

## 2014-01-04 DIAGNOSIS — N3020 Other chronic cystitis without hematuria: Secondary | ICD-10-CM | POA: Diagnosis not present

## 2014-01-04 DIAGNOSIS — N8111 Cystocele, midline: Secondary | ICD-10-CM | POA: Diagnosis not present

## 2014-01-04 DIAGNOSIS — R3912 Poor urinary stream: Secondary | ICD-10-CM | POA: Diagnosis not present

## 2014-01-04 DIAGNOSIS — N3941 Urge incontinence: Secondary | ICD-10-CM | POA: Diagnosis not present

## 2014-01-18 DIAGNOSIS — F17200 Nicotine dependence, unspecified, uncomplicated: Secondary | ICD-10-CM | POA: Diagnosis not present

## 2014-01-18 DIAGNOSIS — M79671 Pain in right foot: Secondary | ICD-10-CM | POA: Diagnosis not present

## 2014-01-18 DIAGNOSIS — S99921A Unspecified injury of right foot, initial encounter: Secondary | ICD-10-CM | POA: Diagnosis not present

## 2014-01-18 DIAGNOSIS — E780 Pure hypercholesterolemia: Secondary | ICD-10-CM | POA: Diagnosis not present

## 2014-01-18 DIAGNOSIS — H3530 Unspecified macular degeneration: Secondary | ICD-10-CM | POA: Diagnosis not present

## 2014-01-18 DIAGNOSIS — I10 Essential (primary) hypertension: Secondary | ICD-10-CM | POA: Diagnosis not present

## 2014-01-23 DIAGNOSIS — M545 Low back pain: Secondary | ICD-10-CM | POA: Diagnosis not present

## 2014-01-23 DIAGNOSIS — M25551 Pain in right hip: Secondary | ICD-10-CM | POA: Diagnosis not present

## 2014-02-01 DIAGNOSIS — M25551 Pain in right hip: Secondary | ICD-10-CM | POA: Diagnosis not present

## 2014-02-03 DIAGNOSIS — M47816 Spondylosis without myelopathy or radiculopathy, lumbar region: Secondary | ICD-10-CM | POA: Diagnosis not present

## 2014-02-08 DIAGNOSIS — S329XXA Fracture of unspecified parts of lumbosacral spine and pelvis, initial encounter for closed fracture: Secondary | ICD-10-CM | POA: Diagnosis not present

## 2014-02-11 ENCOUNTER — Other Ambulatory Visit (HOSPITAL_COMMUNITY): Payer: Self-pay | Admitting: Interventional Radiology

## 2014-02-11 DIAGNOSIS — S3210XA Unspecified fracture of sacrum, initial encounter for closed fracture: Secondary | ICD-10-CM

## 2014-02-11 DIAGNOSIS — M533 Sacrococcygeal disorders, not elsewhere classified: Secondary | ICD-10-CM

## 2014-02-19 ENCOUNTER — Other Ambulatory Visit (HOSPITAL_COMMUNITY): Payer: Self-pay | Admitting: Interventional Radiology

## 2014-02-19 ENCOUNTER — Inpatient Hospital Stay (HOSPITAL_COMMUNITY): Admission: RE | Admit: 2014-02-19 | Payer: Medicare Other | Source: Ambulatory Visit

## 2014-02-19 DIAGNOSIS — M533 Sacrococcygeal disorders, not elsewhere classified: Secondary | ICD-10-CM

## 2014-02-19 DIAGNOSIS — S3210XA Unspecified fracture of sacrum, initial encounter for closed fracture: Secondary | ICD-10-CM

## 2014-02-26 ENCOUNTER — Ambulatory Visit (HOSPITAL_COMMUNITY): Admission: RE | Admit: 2014-02-26 | Payer: Medicare Other | Source: Ambulatory Visit

## 2014-03-17 DIAGNOSIS — N183 Chronic kidney disease, stage 3 (moderate): Secondary | ICD-10-CM | POA: Diagnosis not present

## 2014-03-17 DIAGNOSIS — E780 Pure hypercholesterolemia: Secondary | ICD-10-CM | POA: Diagnosis not present

## 2014-03-17 DIAGNOSIS — M4850XA Collapsed vertebra, not elsewhere classified, site unspecified, initial encounter for fracture: Secondary | ICD-10-CM | POA: Diagnosis not present

## 2014-03-17 DIAGNOSIS — Z7982 Long term (current) use of aspirin: Secondary | ICD-10-CM | POA: Diagnosis not present

## 2014-03-17 DIAGNOSIS — R634 Abnormal weight loss: Secondary | ICD-10-CM | POA: Diagnosis not present

## 2014-03-17 DIAGNOSIS — I10 Essential (primary) hypertension: Secondary | ICD-10-CM | POA: Diagnosis not present

## 2014-03-30 DIAGNOSIS — N8111 Cystocele, midline: Secondary | ICD-10-CM | POA: Diagnosis not present

## 2014-04-14 DIAGNOSIS — M810 Age-related osteoporosis without current pathological fracture: Secondary | ICD-10-CM | POA: Diagnosis not present

## 2014-04-22 DIAGNOSIS — M810 Age-related osteoporosis without current pathological fracture: Secondary | ICD-10-CM | POA: Diagnosis not present

## 2014-04-22 DIAGNOSIS — R748 Abnormal levels of other serum enzymes: Secondary | ICD-10-CM | POA: Diagnosis not present

## 2014-04-28 DIAGNOSIS — I10 Essential (primary) hypertension: Secondary | ICD-10-CM | POA: Diagnosis not present

## 2014-05-05 DIAGNOSIS — N8111 Cystocele, midline: Secondary | ICD-10-CM | POA: Diagnosis not present

## 2014-05-12 ENCOUNTER — Encounter (INDEPENDENT_AMBULATORY_CARE_PROVIDER_SITE_OTHER): Payer: Medicare Other | Admitting: Ophthalmology

## 2014-05-12 DIAGNOSIS — H35033 Hypertensive retinopathy, bilateral: Secondary | ICD-10-CM | POA: Diagnosis not present

## 2014-05-12 DIAGNOSIS — H2513 Age-related nuclear cataract, bilateral: Secondary | ICD-10-CM

## 2014-05-12 DIAGNOSIS — I10 Essential (primary) hypertension: Secondary | ICD-10-CM | POA: Diagnosis not present

## 2014-05-12 DIAGNOSIS — H3532 Exudative age-related macular degeneration: Secondary | ICD-10-CM

## 2014-05-12 DIAGNOSIS — H43813 Vitreous degeneration, bilateral: Secondary | ICD-10-CM

## 2014-05-12 DIAGNOSIS — H3531 Nonexudative age-related macular degeneration: Secondary | ICD-10-CM

## 2014-05-17 DIAGNOSIS — N8111 Cystocele, midline: Secondary | ICD-10-CM | POA: Diagnosis not present

## 2014-05-21 DIAGNOSIS — N8111 Cystocele, midline: Secondary | ICD-10-CM | POA: Diagnosis not present

## 2014-06-09 DIAGNOSIS — I10 Essential (primary) hypertension: Secondary | ICD-10-CM | POA: Diagnosis not present

## 2014-07-07 ENCOUNTER — Encounter (INDEPENDENT_AMBULATORY_CARE_PROVIDER_SITE_OTHER): Payer: Medicare Other | Admitting: Ophthalmology

## 2014-07-07 DIAGNOSIS — H2513 Age-related nuclear cataract, bilateral: Secondary | ICD-10-CM | POA: Diagnosis not present

## 2014-07-07 DIAGNOSIS — H3531 Nonexudative age-related macular degeneration: Secondary | ICD-10-CM | POA: Diagnosis not present

## 2014-07-07 DIAGNOSIS — H3532 Exudative age-related macular degeneration: Secondary | ICD-10-CM | POA: Diagnosis not present

## 2014-07-07 DIAGNOSIS — I10 Essential (primary) hypertension: Secondary | ICD-10-CM

## 2014-07-07 DIAGNOSIS — H43813 Vitreous degeneration, bilateral: Secondary | ICD-10-CM | POA: Diagnosis not present

## 2014-07-14 DIAGNOSIS — N8111 Cystocele, midline: Secondary | ICD-10-CM | POA: Diagnosis not present

## 2014-08-20 DIAGNOSIS — C44311 Basal cell carcinoma of skin of nose: Secondary | ICD-10-CM | POA: Diagnosis not present

## 2014-09-03 DIAGNOSIS — N3941 Urge incontinence: Secondary | ICD-10-CM | POA: Diagnosis not present

## 2014-09-03 DIAGNOSIS — R3912 Poor urinary stream: Secondary | ICD-10-CM | POA: Diagnosis not present

## 2014-09-03 DIAGNOSIS — N8111 Cystocele, midline: Secondary | ICD-10-CM | POA: Diagnosis not present

## 2014-09-08 ENCOUNTER — Encounter (INDEPENDENT_AMBULATORY_CARE_PROVIDER_SITE_OTHER): Payer: Medicare Other | Admitting: Ophthalmology

## 2014-09-08 DIAGNOSIS — H43813 Vitreous degeneration, bilateral: Secondary | ICD-10-CM | POA: Diagnosis not present

## 2014-09-08 DIAGNOSIS — H2513 Age-related nuclear cataract, bilateral: Secondary | ICD-10-CM | POA: Diagnosis not present

## 2014-09-08 DIAGNOSIS — H35033 Hypertensive retinopathy, bilateral: Secondary | ICD-10-CM

## 2014-09-08 DIAGNOSIS — H3532 Exudative age-related macular degeneration: Secondary | ICD-10-CM

## 2014-09-08 DIAGNOSIS — I10 Essential (primary) hypertension: Secondary | ICD-10-CM

## 2014-09-16 DIAGNOSIS — R634 Abnormal weight loss: Secondary | ICD-10-CM | POA: Diagnosis not present

## 2014-09-16 DIAGNOSIS — I10 Essential (primary) hypertension: Secondary | ICD-10-CM | POA: Diagnosis not present

## 2014-09-21 DIAGNOSIS — R32 Unspecified urinary incontinence: Secondary | ICD-10-CM | POA: Diagnosis not present

## 2014-09-21 DIAGNOSIS — N814 Uterovaginal prolapse, unspecified: Secondary | ICD-10-CM | POA: Diagnosis not present

## 2014-09-24 DIAGNOSIS — N8111 Cystocele, midline: Secondary | ICD-10-CM | POA: Diagnosis not present

## 2014-09-24 DIAGNOSIS — N3946 Mixed incontinence: Secondary | ICD-10-CM | POA: Diagnosis not present

## 2014-10-22 DIAGNOSIS — Z23 Encounter for immunization: Secondary | ICD-10-CM | POA: Diagnosis not present

## 2014-11-10 ENCOUNTER — Encounter (INDEPENDENT_AMBULATORY_CARE_PROVIDER_SITE_OTHER): Payer: Medicare Other | Admitting: Ophthalmology

## 2014-11-17 ENCOUNTER — Encounter (INDEPENDENT_AMBULATORY_CARE_PROVIDER_SITE_OTHER): Payer: Medicare Other | Admitting: Ophthalmology

## 2014-11-17 DIAGNOSIS — H353231 Exudative age-related macular degeneration, bilateral, with active choroidal neovascularization: Secondary | ICD-10-CM

## 2014-11-17 DIAGNOSIS — H43813 Vitreous degeneration, bilateral: Secondary | ICD-10-CM

## 2014-11-17 DIAGNOSIS — I10 Essential (primary) hypertension: Secondary | ICD-10-CM

## 2014-11-17 DIAGNOSIS — H35033 Hypertensive retinopathy, bilateral: Secondary | ICD-10-CM

## 2014-11-29 DIAGNOSIS — N8111 Cystocele, midline: Secondary | ICD-10-CM | POA: Diagnosis not present

## 2014-12-10 DIAGNOSIS — N393 Stress incontinence (female) (male): Secondary | ICD-10-CM | POA: Diagnosis not present

## 2014-12-16 ENCOUNTER — Other Ambulatory Visit: Payer: Self-pay | Admitting: Urology

## 2015-01-03 ENCOUNTER — Encounter (HOSPITAL_BASED_OUTPATIENT_CLINIC_OR_DEPARTMENT_OTHER): Payer: Self-pay | Admitting: *Deleted

## 2015-01-03 NOTE — Progress Notes (Signed)
SPOKE W/ PT SON, DR STUART Romaniello.  NPO AFTER MN. ARRIVE AT 1030.  NEEDS ISTAT AND EKG.  IF TAKE PT TAKES ATENOLOL WILL TAKE AM DOS W/ SIPS OF WATER. PER SON PT IS HAS HIGH ANXIETY.

## 2015-01-05 DIAGNOSIS — I10 Essential (primary) hypertension: Secondary | ICD-10-CM | POA: Diagnosis not present

## 2015-01-05 DIAGNOSIS — R634 Abnormal weight loss: Secondary | ICD-10-CM | POA: Diagnosis not present

## 2015-01-07 ENCOUNTER — Ambulatory Visit (HOSPITAL_BASED_OUTPATIENT_CLINIC_OR_DEPARTMENT_OTHER): Payer: Medicare Other | Admitting: Anesthesiology

## 2015-01-07 ENCOUNTER — Encounter (HOSPITAL_BASED_OUTPATIENT_CLINIC_OR_DEPARTMENT_OTHER)
Admission: RE | Disposition: A | Discharge status: Home / Self Care | Payer: Self-pay | Source: Ambulatory Visit | Attending: Urology

## 2015-01-07 ENCOUNTER — Ambulatory Visit (HOSPITAL_BASED_OUTPATIENT_CLINIC_OR_DEPARTMENT_OTHER)
Admission: RE | Admit: 2015-01-07 | Discharge: 2015-01-07 | Disposition: A | Discharge status: Home / Self Care | Payer: Medicare Other | Source: Ambulatory Visit | Attending: Urology | Admitting: Urology

## 2015-01-07 ENCOUNTER — Encounter (HOSPITAL_BASED_OUTPATIENT_CLINIC_OR_DEPARTMENT_OTHER): Payer: Self-pay | Admitting: Anesthesiology

## 2015-01-07 ENCOUNTER — Other Ambulatory Visit: Payer: Self-pay

## 2015-01-07 DIAGNOSIS — Z79899 Other long term (current) drug therapy: Secondary | ICD-10-CM | POA: Insufficient documentation

## 2015-01-07 DIAGNOSIS — K219 Gastro-esophageal reflux disease without esophagitis: Secondary | ICD-10-CM | POA: Insufficient documentation

## 2015-01-07 DIAGNOSIS — R3915 Urgency of urination: Secondary | ICD-10-CM | POA: Diagnosis present

## 2015-01-07 DIAGNOSIS — N3642 Intrinsic sphincter deficiency (ISD): Secondary | ICD-10-CM | POA: Insufficient documentation

## 2015-01-07 DIAGNOSIS — Z8051 Family history of malignant neoplasm of kidney: Secondary | ICD-10-CM | POA: Insufficient documentation

## 2015-01-07 DIAGNOSIS — N3281 Overactive bladder: Secondary | ICD-10-CM | POA: Insufficient documentation

## 2015-01-07 DIAGNOSIS — I10 Essential (primary) hypertension: Secondary | ICD-10-CM | POA: Insufficient documentation

## 2015-01-07 DIAGNOSIS — F17200 Nicotine dependence, unspecified, uncomplicated: Secondary | ICD-10-CM | POA: Insufficient documentation

## 2015-01-07 DIAGNOSIS — E785 Hyperlipidemia, unspecified: Secondary | ICD-10-CM | POA: Diagnosis not present

## 2015-01-07 DIAGNOSIS — M1990 Unspecified osteoarthritis, unspecified site: Secondary | ICD-10-CM | POA: Diagnosis not present

## 2015-01-07 DIAGNOSIS — N3946 Mixed incontinence: Secondary | ICD-10-CM | POA: Diagnosis not present

## 2015-01-07 DIAGNOSIS — Z841 Family history of disorders of kidney and ureter: Secondary | ICD-10-CM | POA: Insufficient documentation

## 2015-01-07 DIAGNOSIS — N393 Stress incontinence (female) (male): Secondary | ICD-10-CM | POA: Diagnosis not present

## 2015-01-07 HISTORY — DX: Other complications of anesthesia, initial encounter: T88.59XA

## 2015-01-07 HISTORY — DX: Anxiety disorder, unspecified: F41.9

## 2015-01-07 HISTORY — DX: Personal history of other diseases of the respiratory system: Z87.09

## 2015-01-07 HISTORY — PX: CYSTOSCOPY MACROPLASTIQUE IMPLANT: SHX6636

## 2015-01-07 HISTORY — DX: Adverse effect of unspecified anesthetic, initial encounter: T41.45XA

## 2015-01-07 HISTORY — DX: Legal blindness, as defined in USA: H54.8

## 2015-01-07 HISTORY — DX: Stress incontinence (female) (male): N39.3

## 2015-01-07 HISTORY — DX: Hyperlipidemia, unspecified: E78.5

## 2015-01-07 LAB — POCT I-STAT, CHEM 8
BUN: 30 mg/dL — ABNORMAL HIGH (ref 6–20)
Calcium, Ion: 1.25 mmol/L (ref 1.13–1.30)
Chloride: 103 mmol/L (ref 101–111)
Creatinine, Ser: 1.5 mg/dL — ABNORMAL HIGH (ref 0.44–1.00)
Glucose, Bld: 90 mg/dL (ref 65–99)
HCT: 42 % (ref 36.0–46.0)
Hemoglobin: 14.3 g/dL (ref 12.0–15.0)
Potassium: 3.9 mmol/L (ref 3.5–5.1)
Sodium: 143 mmol/L (ref 135–145)
TCO2: 27 mmol/L (ref 0–100)

## 2015-01-07 SURGERY — CYSTOSCOPY, WITH MACROPLASTIQUE INJECTION
Anesthesia: General | Site: Bladder

## 2015-01-07 MED ORDER — LIDOCAINE HCL (CARDIAC) 20 MG/ML IV SOLN
INTRAVENOUS | Status: DC | PRN
  Administered 2015-01-07: 50 mg via INTRAVENOUS

## 2015-01-07 MED ORDER — PROPOFOL 500 MG/50ML IV EMUL
INTRAVENOUS | Status: DC | PRN
Start: 2015-01-07 — End: 2015-01-07
  Administered 2015-01-07: 25 ug/kg/min via INTRAVENOUS

## 2015-01-07 MED ORDER — STERILE WATER FOR IRRIGATION IR SOLN
Status: DC | PRN
  Administered 2015-01-07: 3000 mL

## 2015-01-07 MED ORDER — FENTANYL CITRATE (PF) 100 MCG/2ML IJ SOLN
INTRAMUSCULAR | Status: AC
  Filled 2015-01-07: qty 2

## 2015-01-07 MED ORDER — PROMETHAZINE HCL 25 MG/ML IJ SOLN
6.2500 mg | INTRAMUSCULAR | Status: DC | PRN
  Filled 2015-01-07: qty 1

## 2015-01-07 MED ORDER — EPHEDRINE SULFATE 50 MG/ML IJ SOLN
INTRAMUSCULAR | Status: AC
  Filled 2015-01-07: qty 1

## 2015-01-07 MED ORDER — CIPROFLOXACIN IN D5W 400 MG/200ML IV SOLN
400.0000 mg | INTRAVENOUS | Status: AC
  Administered 2015-01-07: 400 mg via INTRAVENOUS
  Filled 2015-01-07: qty 200

## 2015-01-07 MED ORDER — LIDOCAINE HCL (CARDIAC) 20 MG/ML IV SOLN
INTRAVENOUS | Status: AC
  Filled 2015-01-07: qty 5

## 2015-01-07 MED ORDER — FENTANYL CITRATE (PF) 100 MCG/2ML IJ SOLN
25.0000 ug | INTRAMUSCULAR | Status: DC | PRN
  Filled 2015-01-07: qty 1

## 2015-01-07 MED ORDER — HYDROCODONE-ACETAMINOPHEN 7.5-325 MG PO TABS
1.0000 | ORAL_TABLET | Freq: Once | ORAL | Status: DC | PRN
  Filled 2015-01-07: qty 1

## 2015-01-07 MED ORDER — ONDANSETRON HCL 4 MG/2ML IJ SOLN
INTRAMUSCULAR | Status: AC
  Filled 2015-01-07: qty 2

## 2015-01-07 MED ORDER — MEPERIDINE HCL 25 MG/ML IJ SOLN
6.2500 mg | INTRAMUSCULAR | Status: DC | PRN
  Filled 2015-01-07: qty 1

## 2015-01-07 MED ORDER — ONDANSETRON HCL 4 MG/2ML IJ SOLN
INTRAMUSCULAR | Status: DC | PRN
  Administered 2015-01-07: 4 mg via INTRAVENOUS

## 2015-01-07 MED ORDER — FENTANYL CITRATE (PF) 100 MCG/2ML IJ SOLN
INTRAMUSCULAR | Status: DC | PRN
  Administered 2015-01-07 (×2): 25 ug via INTRAVENOUS

## 2015-01-07 MED ORDER — CIPROFLOXACIN IN D5W 400 MG/200ML IV SOLN
INTRAVENOUS | Status: AC
  Filled 2015-01-07: qty 200

## 2015-01-07 MED ORDER — PROPOFOL 10 MG/ML IV BOLUS
INTRAVENOUS | Status: DC | PRN
  Administered 2015-01-07: 20 mg via INTRAVENOUS
  Administered 2015-01-07 (×2): 50 mg via INTRAVENOUS

## 2015-01-07 MED ORDER — LIDOCAINE HCL 2 % EX GEL
CUTANEOUS | Status: AC
  Filled 2015-01-07: qty 5

## 2015-01-07 MED ORDER — EPHEDRINE SULFATE 50 MG/ML IJ SOLN
INTRAMUSCULAR | Status: DC | PRN
  Administered 2015-01-07 (×2): 10 mg via INTRAVENOUS
  Administered 2015-01-07: 15 mg via INTRAVENOUS

## 2015-01-07 MED ORDER — PROPOFOL 10 MG/ML IV BOLUS
INTRAVENOUS | Status: AC
  Filled 2015-01-07: qty 20

## 2015-01-07 MED ORDER — LACTATED RINGERS IV SOLN
INTRAVENOUS | Status: DC
  Administered 2015-01-07: 11:00:00 via INTRAVENOUS
  Filled 2015-01-07: qty 1000

## 2015-01-07 SURGICAL SUPPLY — 27 items
BAG DRAIN URO-CYSTO SKYTR STRL (DRAIN) ×2 IMPLANT
CANISTER SUCT LVC 12 LTR MEDI- (MISCELLANEOUS) IMPLANT
CATH ROBINSON RED A/P 12FR (CATHETERS) IMPLANT
CATH ROBINSON RED A/P 14FR (CATHETERS) ×2 IMPLANT
CLOTH BEACON ORANGE TIMEOUT ST (SAFETY) ×2 IMPLANT
ELECT REM PT RETURN 9FT ADLT (ELECTROSURGICAL)
ELECTRODE REM PT RTRN 9FT ADLT (ELECTROSURGICAL) IMPLANT
GLOVE BIO SURGEON STRL SZ7 (GLOVE) ×2 IMPLANT
GLOVE BIO SURGEON STRL SZ7.5 (GLOVE) ×2 IMPLANT
GLOVE BIOGEL PI IND STRL 7.0 (GLOVE) ×1 IMPLANT
GLOVE BIOGEL PI INDICATOR 7.0 (GLOVE) ×1
GOWN STRL REUS W/ TWL LRG LVL3 (GOWN DISPOSABLE) ×1 IMPLANT
GOWN STRL REUS W/ TWL XL LVL3 (GOWN DISPOSABLE) ×1 IMPLANT
GOWN STRL REUS W/TWL LRG LVL3 (GOWN DISPOSABLE) ×3 IMPLANT
GOWN STRL REUS W/TWL XL LVL3 (GOWN DISPOSABLE) ×1
INJECTION MACROPLASIQ 2.5ML UN (Female Continence) ×2 IMPLANT
KIT ROOM TURNOVER WOR (KITS) ×2 IMPLANT
MACROPLASTIQUE 2.5ML UNIT (Female Continence) ×4 IMPLANT
MANIFOLD NEPTUNE II (INSTRUMENTS) ×2 IMPLANT
NDL SAFETY ECLIPSE 18X1.5 (NEEDLE) ×1 IMPLANT
NEEDLE HYPO 18GX1.5 SHARP (NEEDLE) ×1
NEEDLE RIGID UROPLASTY (NEEDLE) ×2 IMPLANT
PACK CYSTO (CUSTOM PROCEDURE TRAY) ×2 IMPLANT
SYR 20CC LL (SYRINGE) ×2 IMPLANT
SYR BULB IRRIGATION 50ML (SYRINGE) IMPLANT
TUBE CONNECTING 12X1/4 (SUCTIONS) IMPLANT
WATER STERILE IRR 3000ML UROMA (IV SOLUTION) ×2 IMPLANT

## 2015-01-07 NOTE — Anesthesia Procedure Notes (Signed)
Procedure Name: LMA Insertion Date/Time: 01/07/2015 12:32 PM Performed by: Mechele Claude Pre-anesthesia Checklist: Patient identified, Emergency Drugs available, Suction available and Patient being monitored Patient Re-evaluated:Patient Re-evaluated prior to inductionOxygen Delivery Method: Circle System Utilized Preoxygenation: Pre-oxygenation with 100% oxygen Intubation Type: IV induction Ventilation: Mask ventilation without difficulty LMA: LMA inserted LMA Size: 3.0 Number of attempts: 1 Airway Equipment and Method: bite block Placement Confirmation: positive ETCO2 Tube secured with: Tape Dental Injury: Teeth and Oropharynx as per pre-operative assessment

## 2015-01-07 NOTE — Transfer of Care (Signed)
Last Vitals:  Filed Vitals:   01/07/15 1103  BP: 137/57  Pulse: 58  Temp: 36.9 C  Resp: 16    Immediate Anesthesia Transfer of Care Note  Patient: Karen Calhoun  Procedure(s) Performed: Procedure(s) (LRB): CYSTOSCOPY MACROPLASTIQUE IMPLANT (N/A)  Patient Location: PACU  Anesthesia Type: General  Level of Consciousness: awake, alert  and oriented  Airway & Oxygen Therapy: Patient Spontanous Breathing and Patient connected to face mask oxygen  Post-op Assessment: Report given to PACU RN and Post -op Vital signs reviewed and stable  Post vital signs: Reviewed and stable  Complications: No apparent anesthesia complications

## 2015-01-07 NOTE — Discharge Instructions (Signed)
I have reviewed discharge instructions in detail with the patient. They will follow-up with me or their physician as scheduled. My nurse will also be calling the patients as per protocol.  ° ° ° °CYSTOSCOPY HOME CARE INSTRUCTIONS ° °Activity: °Rest for the remainder of the day.  Do not drive or operate equipment today.  You may resume normal activities in one to two days as instructed by your physician.  ° °Meals: °Drink plenty of liquids and eat light foods such as gelatin or soup this evening.  You may return to a normal meal plan tomorrow. ° °Return to Work: °You may return to work in one to two days or as instructed by your physician. ° °Special Instructions / Symptoms: °Call your physician if any of these symptoms occur: ° ° -persistent or heavy bleeding ° -bleeding which continues after first few urination ° -large blood clots that are difficult to pass ° -urine stream diminishes or stops completely ° -fever equal to or higher than 101 degrees Farenheit. ° -cloudy urine with a strong, foul odor ° -severe pain ° °Females should always wipe from front to back after elimination.  You may feel some burning pain when you urinate.  This should disappear with time.  Applying moist heat to the lower abdomen or a hot tub bath may help relieve the pain. \ ° °Follow-Up / Date of Return Visit to Your Physician:  °Call for an appointment to arrange follow-up. ° ° ° ° °Post Anesthesia Home Care Instructions ° °Activity: °Get plenty of rest for the remainder of the day. A responsible adult should stay with you for 24 hours following the procedure.  °For the next 24 hours, DO NOT: °-Drive a car °-Operate machinery °-Drink alcoholic beverages °-Take any medication unless instructed by your physician °-Make any legal decisions or sign important papers. ° °Meals: °Start with liquid foods such as gelatin or soup. Progress to regular foods as tolerated. Avoid greasy, spicy, heavy foods. If nausea and/or vomiting occur, drink only  clear liquids until the nausea and/or vomiting subsides. Call your physician if vomiting continues. ° °Special Instructions/Symptoms: °Your throat may feel dry or sore from the anesthesia or the breathing tube placed in your throat during surgery. If this causes discomfort, gargle with warm salt water. The discomfort should disappear within 24 hours. ° °If you had a scopolamine patch placed behind your ear for the management of post- operative nausea and/or vomiting: ° °1. The medication in the patch is effective for 72 hours, after which it should be removed.  Wrap patch in a tissue and discard in the trash. Wash hands thoroughly with soap and water. °2. You may remove the patch earlier than 72 hours if you experience unpleasant side effects which may include dry mouth, dizziness or visual disturbances. °3. Avoid touching the patch. Wash your hands with soap and water after contact with the patch. °  ° °

## 2015-01-07 NOTE — H&P (Signed)
History of Present Illness   I was consulted by Dr Felipa Eth regarding Karen Calhoun recent feeling of urgency associated with a poor trickling flow. She used to void every several hours with a good flow. She still can sit through a 2-hour movie, but she will have intense urgency multiple times a day and sit and can only urinate a small amount. She says her flow is better at night. She wonders now that she may have prolapse with something sitting in her vaginal vault and she wonders if it could be affecting her flow.  Twice recently she had foot-on-the-floor syndrome in the middle of the night but is otherwise continent day and night. She gets up 2-3 times a night.    Karen Calhoun had a low capacity unstable bladder. Her Valsalva leak point pressure was 40 cmH2O. It was as low as 33 cmH2O. The leak points were done with the pessary in place. She was on Myrbetriq for her overactive bladder and had an outlet abnormality and is not a good surgical candidate, in my opinion. I reviewed Macroplastique in detail but gave her a trial of Toviaz and VESIcare.   Frequency: Stable.    Past Medical History Problems  1. History of Anxiety (F41.9) 2. History of arthritis (Z87.39) 3. History of esophageal reflux (Z87.19) 4. History of hyperlipidemia (Z86.39) 5. History of hypertension (Z86.79)  Surgical History Problems  1. History of Ankle Repair 2. History of Appendectomy  Current Meds 1. Atenolol 50 MG Oral Tablet;  Therapy: (Recorded:05Nov2015) to Recorded 2. Calcium TABS;  Therapy: (Recorded:05Nov2015) to Recorded 3. Hydralazine-HCTZ 25-25 MG CAPS;  Therapy: (Recorded:05Nov2015) to Recorded 4. Hydrochlorothiazide 25 MG Oral Tablet;  Therapy: (Recorded:05Nov2015) to Recorded 5. Lipitor TABS;  Therapy: (Recorded:05Nov2015) to Recorded 6. Magnesium 250 MG Oral Tablet;  Therapy: (Recorded:05Nov2015) to Recorded 7. Multi Vitamin Daily TABS;  Therapy: (Recorded:05Nov2015) to Recorded 8. Myrbetriq  25 MG Oral Tablet Extended Release 24 Hour; Take 1 tablet daily;  Therapy: [id number] to (Evaluate:01Dec2016); Last Rx:07Dec2015 Ordered 9. Norvasc 5 MG Oral Tablet;  Therapy: (Recorded:05Nov2015) to Recorded 10. Potassium TABS;   Therapy: (Recorded:05Nov2015) to Recorded 11. Toviaz 4 MG Oral Tablet Extended Release 24 Hour; Take 1 tablet daily;   Therapy: 26Aug2016 to (Evaluate:21Aug2017); Last Rx:26Aug2016 Ordered 12. VESIcare 5 MG Oral Tablet; Take 1 tablet daily;   Therapy: 26Aug2016 to (Evaluate:21Aug2017); Last Rx:26Aug2016 Ordered 13. Vitamin D TABS;   Therapy: (Recorded:05Nov2015) to Recorded  Allergies Medication  1. Iodine SOLN  Family History Problems  1. Family history of C. difficile colitis : Sister 2. Family history of Death : Mother, Father   Stroke & Pneumonia (Age 20) 3. Family history of Deceased : Mother, Father 4. Family history of hypertension (Z82.49)   All 5. Family history of kidney cancer (Z80.51) : Father 6. Family history of kidney stones (Z84.1) : Son 7. Family history of prostate cancer (Z80.42) : Brother 8. Family history of Hematuria, undiagnosed cause : Father  Social History Problems  1. Caffeine use (F15.90)   1 cup per day 2. Current smoker (F17.200)   smoke 1/2 PPD 3. Does not use tobacco (Z78.9) 4. No alcohol use 5. Number of children   2 Sons 6. Occupation   Retired  Engineer, site Vital Signs [Data Includes: Last 1 Day]  Recorded: [id number] 01:21PM  Blood Pressure: 133 / 58 Temperature: 98.1 F Heart Rate: 71  Results/Data  10 Dec 2014 1:18 PM  UA With REFLEX    COLOR YELLOW     APPEARANCE CLEAR  SPECIFIC GRAVITY 1.015     pH 6.0     GLUCOSE NEGATIVE     BILIRUBIN NEGATIVE     KETONE NEGATIVE     BLOOD NEGATIVE     PROTEIN NEGATIVE     NITRITE NEGATIVE     LEUKOCYTE ESTERASE NEGATIVE     Plan Urge and stress incontinence  1. Follow-up Schedule Surgery Office  Follow-up  Status: Hold For - Appointment    Requested for: [id number] Urge incontinence of urine  2. Follow-up Month x 6 Office  Follow-up  Status: Canceled  Discussion/Summary   Karen Calhoun is still leaking. She had side effects and lack of efficacy with 2 antimuscarinics. I briefly went over Macroplastique again with the usual consent.   We talked about urethral injectables in detail. Pros, cons, general surgical and anesthetic risks, and other options including behavioral therapy and watchful waiting were discussed. She understands that injectables are generally successful in 40-70% of cases for stress incontinence, less than 50% for urge incontinence, and that in a small percentage of cases the incontinence can worsen. Risks were described but not limited to the risk of persistent, de novo, or worsening incontinence and flow symptoms were discussed. The need for long-term CIC is possible but rare. We also talked about the risk of erosion into bladder, urethra, and/or vagina with sequelae. Bleeding, infection, pain, dysuria, dyspareunia, and urethral irritation risks were discussed. Retreatment rates were discussed. The patient understands that she might not reach her treatment goal and that she might be worse following surgery.  She wanted to try Myrbetriq 50 mg. I gave her 6 weeks of samples. She wanted to go ahead with Macroplastique and we talked about 2 treatments again.   After a thorough review of the management options for the patient's condition the patient  elected to proceed with surgical therapy as noted above. We have discussed the potential benefits and risks of the procedure, side effects of the proposed treatment, the likelihood of the patient achieving the goals of the procedure, and any potential problems that might occur during the procedure or recuperation. Informed consent has been obtained.

## 2015-01-07 NOTE — Anesthesia Preprocedure Evaluation (Signed)
Anesthesia Evaluation  Patient identified by MRN, date of birth, ID band Patient awake    Reviewed: Allergy & Precautions, NPO status , Patient's Chart, lab work & pertinent test results  Airway Mallampati: II  TM Distance: >3 FB Neck ROM: Full    Dental no notable dental hx.    Pulmonary neg pulmonary ROS, Current Smoker,    Pulmonary exam normal breath sounds clear to auscultation       Cardiovascular hypertension, Pt. on medications and Pt. on home beta blockers Normal cardiovascular exam Rhythm:Regular Rate:Normal     Neuro/Psych negative neurological ROS  negative psych ROS   GI/Hepatic negative GI ROS, Neg liver ROS,   Endo/Other  negative endocrine ROS  Renal/GU negative Renal ROS     Musculoskeletal negative musculoskeletal ROS (+)   Abdominal   Peds  Hematology negative hematology ROS (+)   Anesthesia Other Findings   Reproductive/Obstetrics                             Anesthesia Physical Anesthesia Plan  ASA: III  Anesthesia Plan: General   Post-op Pain Management:    Induction: Intravenous  Airway Management Planned: LMA  Additional Equipment:   Intra-op Plan:   Post-operative Plan: Extubation in OR  Informed Consent: I have reviewed the patients History and Physical, chart, labs and discussed the procedure including the risks, benefits and alternatives for the proposed anesthesia with the patient or authorized representative who has indicated his/her understanding and acceptance.   Dental advisory given  Plan Discussed with: CRNA  Anesthesia Plan Comments:         Anesthesia Quick Evaluation

## 2015-01-07 NOTE — Op Note (Signed)
Preoperative diagnosis: Stress incontinence and intrinsic sphincter deficiency Postoperative diagnosis: Stress urinary incontinence and intrinsic sphincter deficiency Surgery: Cystoscopy injection of Macroplastique Surgeon: Dr. Nicki Reaper MacDiarmid  The patient has above diagnoses and consented above procedure. She had MAC anesthesia but because of movement etc. she had an LMA eventually. Preoperative antibiotics were given. Injection scope was utilized.  Bladder mucosa and trigone were normal. There is no cystitis. She had a healthy straight urethra.  With the described technique I injected one syringe of Macroplastique a 5:00 with excellent coaptation. I had a 1 mL out 7:00 and was even better coaptation. I did not put more than this.  Retrograde over catheter drain the bladder. The procedure went very well and hopefully it helps reach her treatment goal

## 2015-01-10 ENCOUNTER — Encounter (HOSPITAL_BASED_OUTPATIENT_CLINIC_OR_DEPARTMENT_OTHER): Payer: Self-pay | Admitting: Urology

## 2015-01-10 NOTE — Anesthesia Postprocedure Evaluation (Signed)
Anesthesia Post Note  Patient: Karen Calhoun  Procedure(s) Performed: Procedure(s) (LRB): CYSTOSCOPY MACROPLASTIQUE IMPLANT (N/A)  Patient location during evaluation: PACU Anesthesia Type: General Level of consciousness: sedated and patient cooperative Pain management: pain level controlled Vital Signs Assessment: post-procedure vital signs reviewed and stable Respiratory status: spontaneous breathing Cardiovascular status: stable Anesthetic complications: no    Last Vitals:  Filed Vitals:   01/07/15 1330 01/07/15 1340  BP:  129/54  Pulse: 61 61  Temp:    Resp: 15 17    Last Pain:  Filed Vitals:   01/07/15 1346  PainSc: 2                  Nolon Nations

## 2015-01-12 ENCOUNTER — Encounter (INDEPENDENT_AMBULATORY_CARE_PROVIDER_SITE_OTHER): Payer: Medicare Other | Admitting: Ophthalmology

## 2015-01-12 DIAGNOSIS — I10 Essential (primary) hypertension: Secondary | ICD-10-CM | POA: Diagnosis not present

## 2015-01-12 DIAGNOSIS — H43813 Vitreous degeneration, bilateral: Secondary | ICD-10-CM | POA: Diagnosis not present

## 2015-01-12 DIAGNOSIS — H2513 Age-related nuclear cataract, bilateral: Secondary | ICD-10-CM

## 2015-01-12 DIAGNOSIS — H35033 Hypertensive retinopathy, bilateral: Secondary | ICD-10-CM

## 2015-01-12 DIAGNOSIS — H353231 Exudative age-related macular degeneration, bilateral, with active choroidal neovascularization: Secondary | ICD-10-CM | POA: Diagnosis not present

## 2015-01-21 DIAGNOSIS — N8111 Cystocele, midline: Secondary | ICD-10-CM | POA: Diagnosis not present

## 2015-01-21 DIAGNOSIS — N3946 Mixed incontinence: Secondary | ICD-10-CM | POA: Diagnosis not present

## 2015-01-21 DIAGNOSIS — N3020 Other chronic cystitis without hematuria: Secondary | ICD-10-CM | POA: Diagnosis not present

## 2015-02-04 DIAGNOSIS — N814 Uterovaginal prolapse, unspecified: Secondary | ICD-10-CM | POA: Diagnosis not present

## 2015-03-16 ENCOUNTER — Encounter (INDEPENDENT_AMBULATORY_CARE_PROVIDER_SITE_OTHER): Payer: Medicare Other | Admitting: Ophthalmology

## 2015-03-23 ENCOUNTER — Encounter (INDEPENDENT_AMBULATORY_CARE_PROVIDER_SITE_OTHER): Payer: Medicare Other | Admitting: Ophthalmology

## 2015-03-23 DIAGNOSIS — H43813 Vitreous degeneration, bilateral: Secondary | ICD-10-CM

## 2015-03-23 DIAGNOSIS — H353231 Exudative age-related macular degeneration, bilateral, with active choroidal neovascularization: Secondary | ICD-10-CM | POA: Diagnosis not present

## 2015-03-23 DIAGNOSIS — H35033 Hypertensive retinopathy, bilateral: Secondary | ICD-10-CM | POA: Diagnosis not present

## 2015-03-23 DIAGNOSIS — H2513 Age-related nuclear cataract, bilateral: Secondary | ICD-10-CM | POA: Diagnosis not present

## 2015-03-23 DIAGNOSIS — I10 Essential (primary) hypertension: Secondary | ICD-10-CM

## 2015-04-08 IMAGING — CR DG CERVICAL SPINE 2 OR 3 VIEWS
3 series · 3 of 3 positions shown · non-contrast
Comparison: None.

CLINICAL DATA: Cervical pain. Left shoulder pain. No trauma history
submitted.

EXAM:
CERVICAL SPINE - 2-3 VIEW

[w c-spine lat]
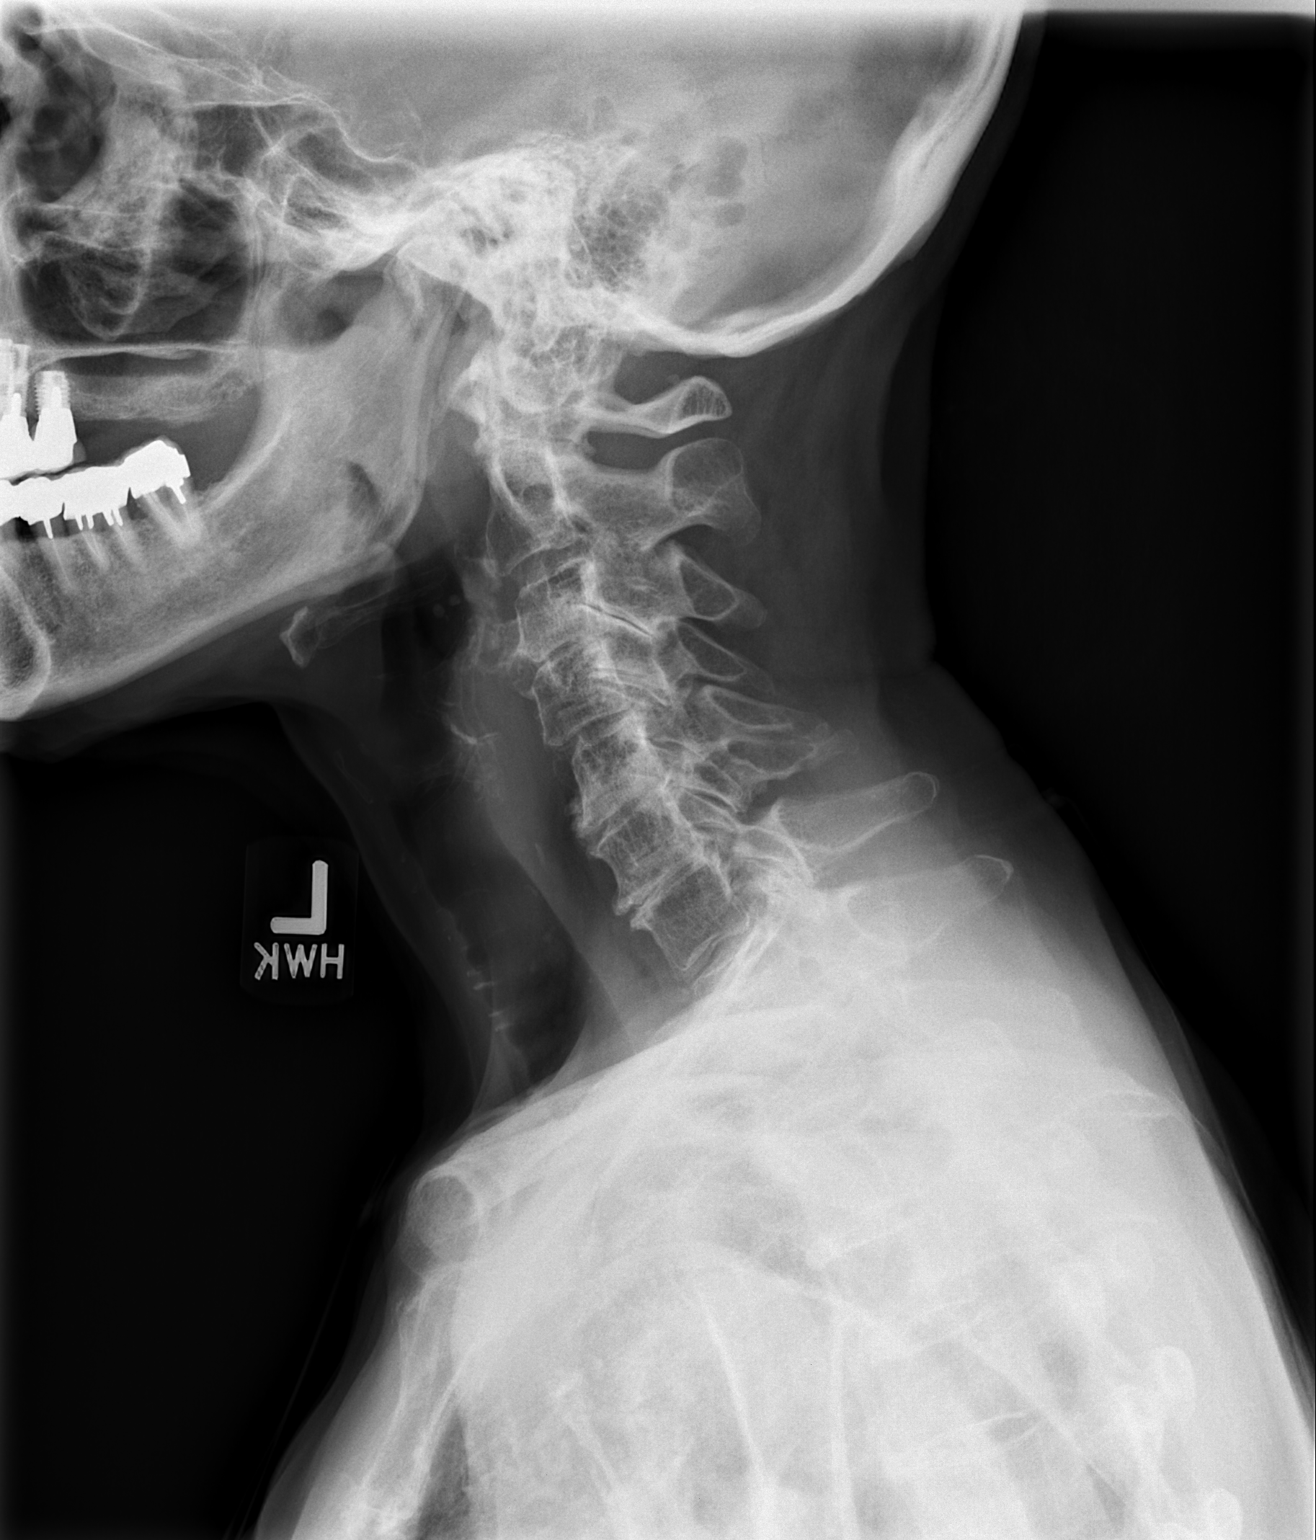

[w c-spine a.p. *]
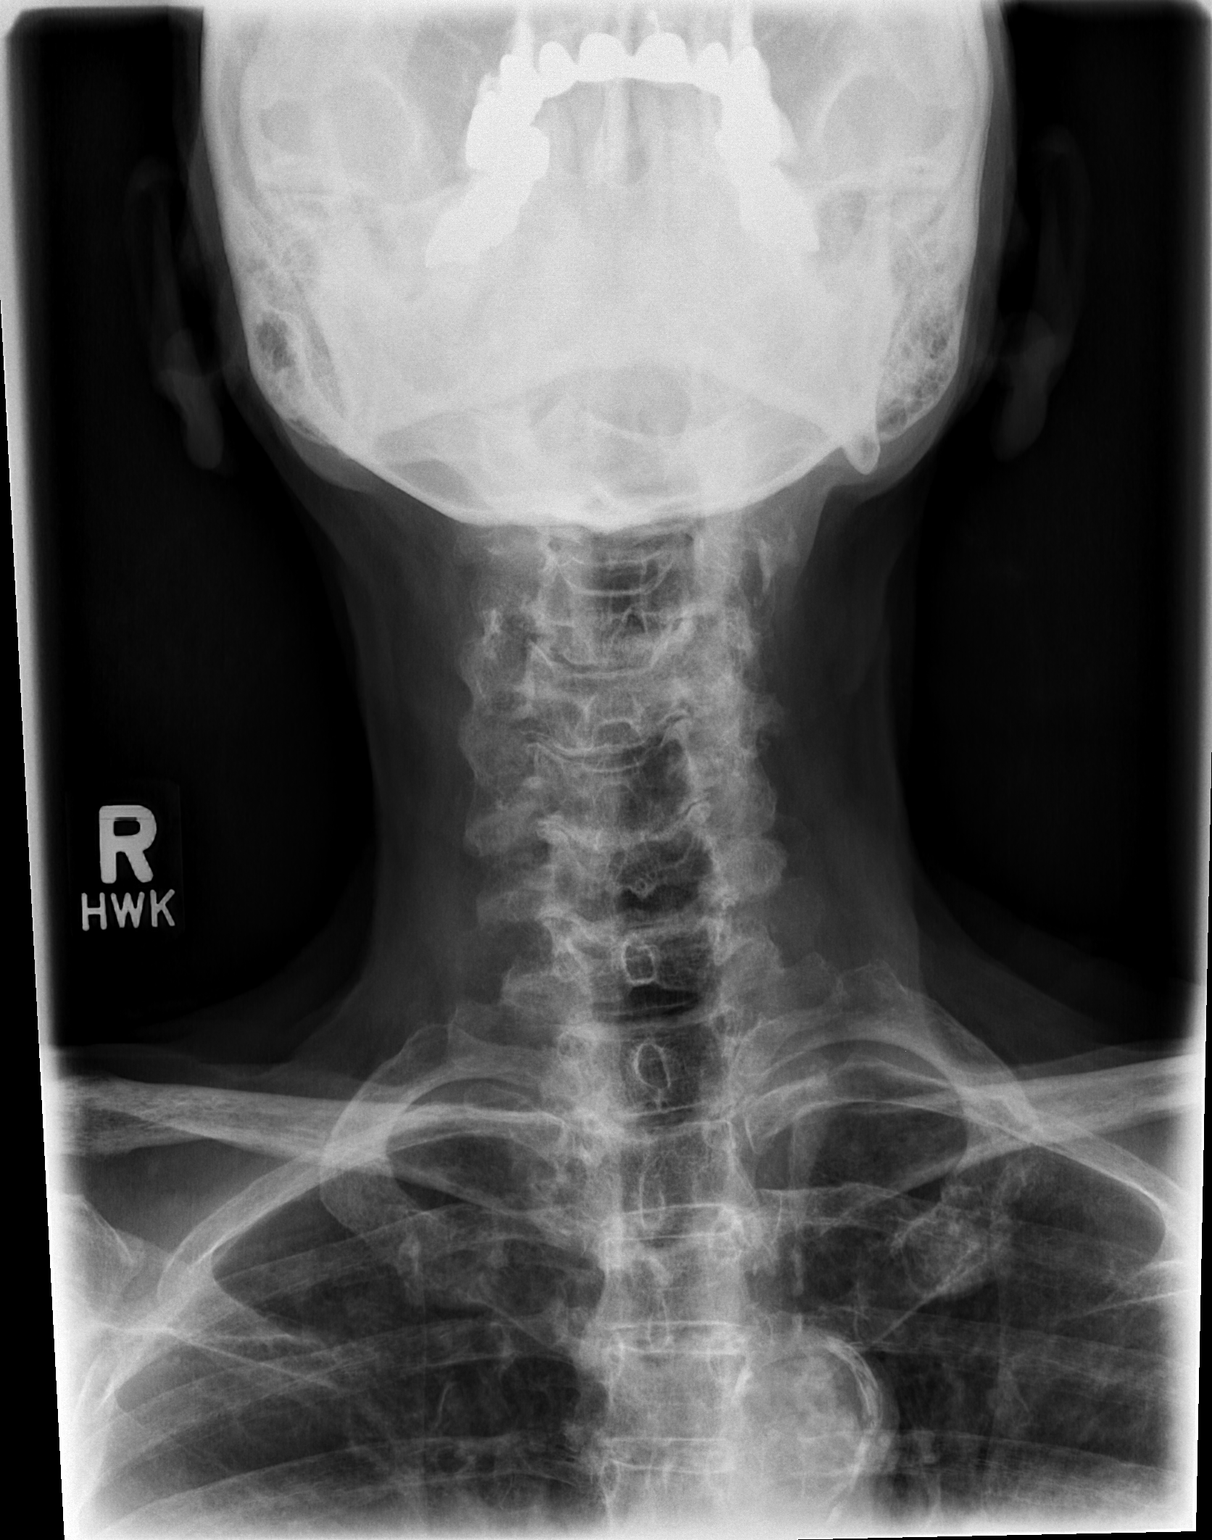

[w c-spine odontoid *]
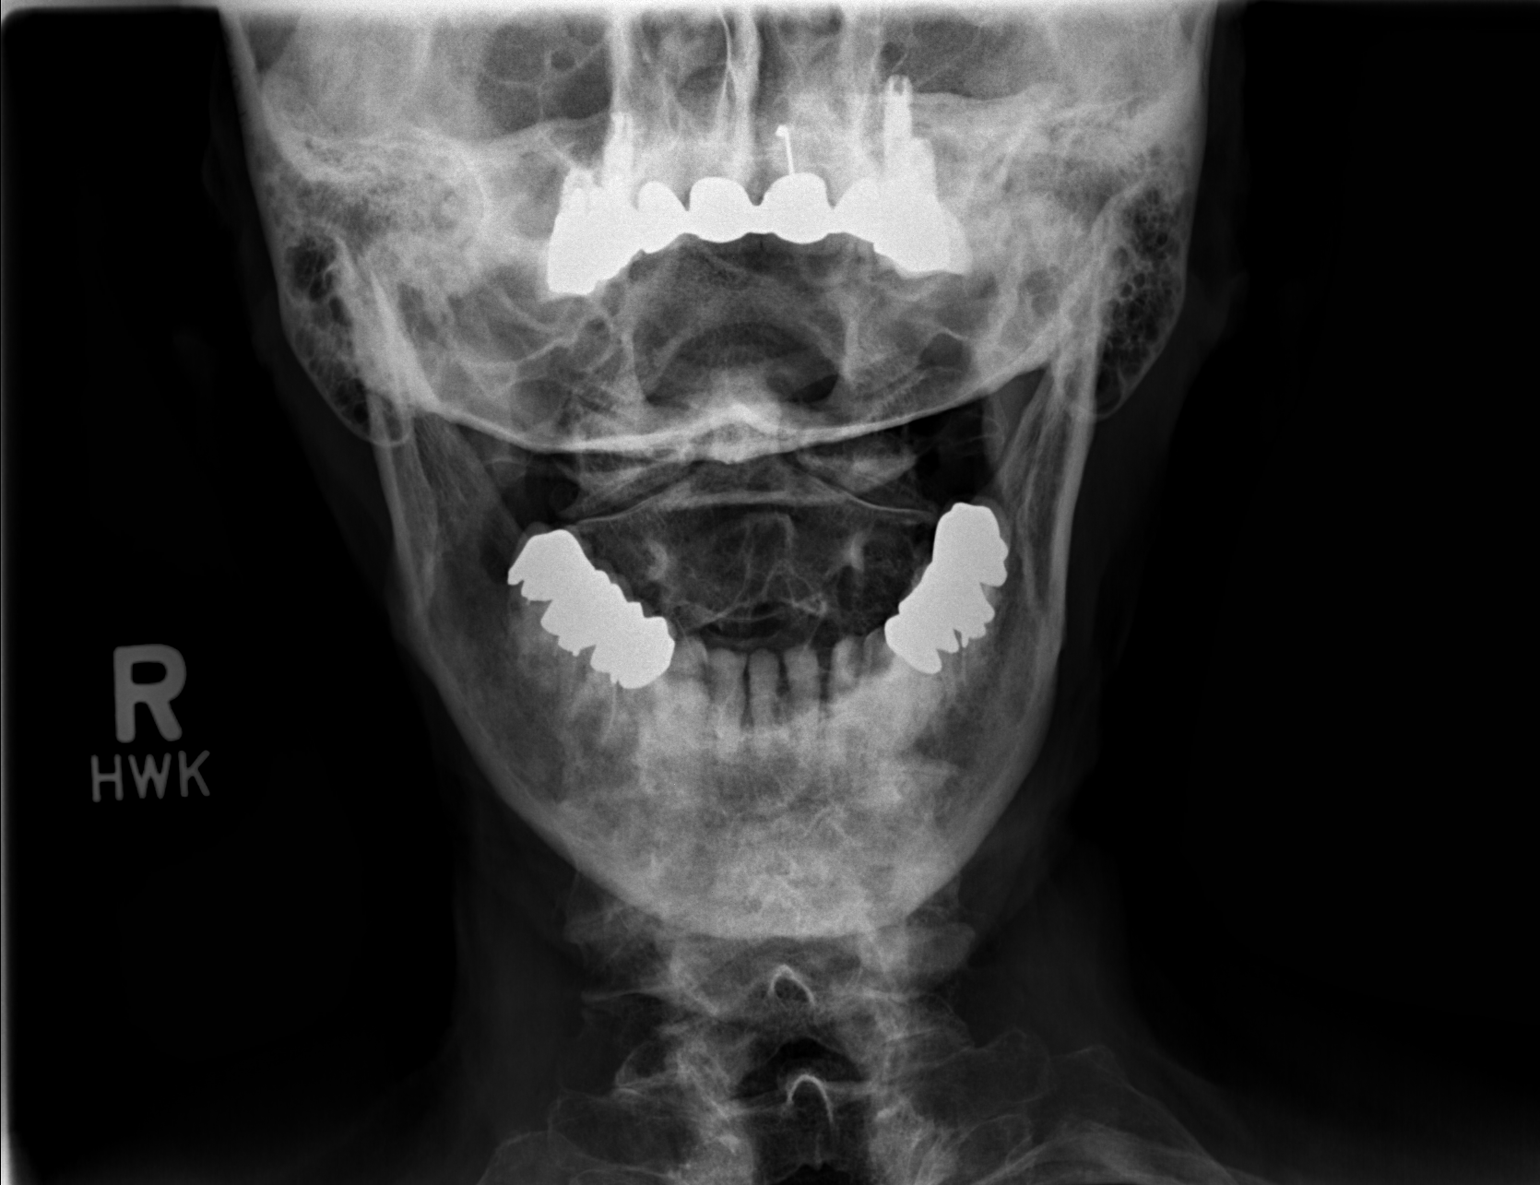

[3 of 3 positions shown; findings below may reference images not displayed]

FINDINGS: The lateral view images through the bottom of C7. Prevertebral soft
tissues prominent but within normal variation C4-5, 1.6 cm.

Trace C3-4 retrolisthesis. Advanced spondylosis at multiple levels
with intervertebral disc height loss at C3-4 through C6-7. Prominent
endplate osteophytes at these levels. Maintenance of vertebral body
height.

Lateral masses and odontoid process partially obscured. Body of C2
grossly intact.
IMPRESSION: Advanced spondylosis, without acute osseous finding.

Trace C3-4 retrolisthesis, likely secondary to spondylosis.

## 2015-04-18 DIAGNOSIS — N814 Uterovaginal prolapse, unspecified: Secondary | ICD-10-CM | POA: Diagnosis not present

## 2015-05-11 DIAGNOSIS — Z0000 Encounter for general adult medical examination without abnormal findings: Secondary | ICD-10-CM | POA: Diagnosis not present

## 2015-05-11 DIAGNOSIS — F17200 Nicotine dependence, unspecified, uncomplicated: Secondary | ICD-10-CM | POA: Diagnosis not present

## 2015-05-11 DIAGNOSIS — E7800 Pure hypercholesterolemia, unspecified: Secondary | ICD-10-CM | POA: Diagnosis not present

## 2015-05-16 DIAGNOSIS — I10 Essential (primary) hypertension: Secondary | ICD-10-CM | POA: Diagnosis not present

## 2015-05-16 DIAGNOSIS — N183 Chronic kidney disease, stage 3 (moderate): Secondary | ICD-10-CM | POA: Diagnosis not present

## 2015-05-16 DIAGNOSIS — Z7982 Long term (current) use of aspirin: Secondary | ICD-10-CM | POA: Diagnosis not present

## 2015-05-16 DIAGNOSIS — M810 Age-related osteoporosis without current pathological fracture: Secondary | ICD-10-CM | POA: Diagnosis not present

## 2015-05-20 DIAGNOSIS — Z0000 Encounter for general adult medical examination without abnormal findings: Secondary | ICD-10-CM | POA: Diagnosis not present

## 2015-05-20 DIAGNOSIS — N3946 Mixed incontinence: Secondary | ICD-10-CM | POA: Diagnosis not present

## 2015-06-01 ENCOUNTER — Encounter (INDEPENDENT_AMBULATORY_CARE_PROVIDER_SITE_OTHER): Payer: Medicare Other | Admitting: Ophthalmology

## 2015-06-01 DIAGNOSIS — H353231 Exudative age-related macular degeneration, bilateral, with active choroidal neovascularization: Secondary | ICD-10-CM

## 2015-06-01 DIAGNOSIS — H2513 Age-related nuclear cataract, bilateral: Secondary | ICD-10-CM | POA: Diagnosis not present

## 2015-06-01 DIAGNOSIS — H35033 Hypertensive retinopathy, bilateral: Secondary | ICD-10-CM

## 2015-06-01 DIAGNOSIS — H43813 Vitreous degeneration, bilateral: Secondary | ICD-10-CM

## 2015-06-01 DIAGNOSIS — I10 Essential (primary) hypertension: Secondary | ICD-10-CM

## 2015-06-17 ENCOUNTER — Ambulatory Visit (INDEPENDENT_AMBULATORY_CARE_PROVIDER_SITE_OTHER): Payer: Medicare Other

## 2015-06-17 ENCOUNTER — Ambulatory Visit (HOSPITAL_COMMUNITY)
Admission: EM | Admit: 2015-06-17 | Discharge: 2015-06-17 | Disposition: A | Discharge status: Home / Self Care | Payer: Medicare Other | Attending: Emergency Medicine | Admitting: Emergency Medicine

## 2015-06-17 ENCOUNTER — Encounter (HOSPITAL_COMMUNITY): Payer: Self-pay | Admitting: Emergency Medicine

## 2015-06-17 DIAGNOSIS — W19XXXA Unspecified fall, initial encounter: Secondary | ICD-10-CM

## 2015-06-17 DIAGNOSIS — M25552 Pain in left hip: Secondary | ICD-10-CM | POA: Diagnosis not present

## 2015-06-17 NOTE — ED Notes (Signed)
Patient reports a fall in her kitchen last night.  Reports getting feet tangled up.  Reports landing on right side of body, but left side of body and left lower back has pain.  Contusion to forehead

## 2015-06-17 NOTE — Discharge Instructions (Signed)
Cryotherapy Cryotherapy is when you put ice on your injury. Ice helps lessen pain and puffiness (swelling) after an injury. Ice works the best when you start using it in the first 24 to 48 hours after an injury. HOME CARE  Put a dry or damp towel between the ice pack and your skin.  You may press gently on the ice pack.  Leave the ice on for no more than 10 to 20 minutes at a time.  Check your skin after 5 minutes to make sure your skin is okay.  Rest at least 20 minutes between ice pack uses.  Stop using ice when your skin loses feeling (numbness).  Do not use ice on someone who cannot tell you when it hurts. This includes small children and people with memory problems (dementia). GET HELP RIGHT AWAY IF:  You have white spots on your skin.  Your skin turns blue or pale.  Your skin feels waxy or hard.  Your puffiness gets worse. MAKE SURE YOU:   Understand these instructions.  Will watch your condition.  Will get help right away if you are not doing well or get worse.   This information is not intended to replace advice given to you by your health care provider. Make sure you discuss any questions you have with your health care provider.   Document Released: 07/04/2007 Document Revised: 04/09/2011 Document Reviewed: 09/07/2010 Elsevier Interactive Patient Education 2016 [place] therapy can help ease sore, stiff, injured, and tight muscles and joints. Heat relaxes your muscles, which may help ease your pain.  RISKS AND COMPLICATIONS If you have any of the following conditions, do not use heat therapy unless your health care provider has approved:  Poor circulation.  Healing wounds or scarred skin in the area being treated.  Diabetes, heart disease, or high blood pressure.  Not being able to feel (numbness) the area being treated.  Unusual swelling of the area being treated.  Active infections.  Blood clots.  Cancer.  Inability to  communicate pain. This may include young children and people who have problems with their brain function (dementia).  Pregnancy. Heat therapy should only be used on old, pre-existing, or long-lasting (chronic) injuries. Do not use heat therapy on new injuries unless directed by your health care provider. HOW TO USE HEAT THERAPY There are several different kinds of heat therapy, including:  Moist heat pack.  Warm water bath.  Hot water bottle.  Electric heating pad.  Heated gel pack.  Heated wrap.  Electric heating pad. Use the heat therapy method suggested by your health care provider. Follow your health care provider's instructions on when and how to use heat therapy. GENERAL HEAT THERAPY RECOMMENDATIONS  Do not sleep while using heat therapy. Only use heat therapy while you are awake.  Your skin may turn pink while using heat therapy. Do not use heat therapy if your skin turns red.  Do not use heat therapy if you have new pain.  High heat or long exposure to heat can cause burns. Be careful when using heat therapy to avoid burning your skin.  Do not use heat therapy on areas of your skin that are already irritated, such as with a rash or sunburn. SEEK MEDICAL CARE IF:  You have blisters, redness, swelling, or numbness.  You have new pain.  Your pain is worse. MAKE SURE YOU:  Understand these instructions.  Will watch your condition.  Will get help right away if you  are not doing well or get worse.   This information is not intended to replace advice given to you by your health care provider. Make sure you discuss any questions you have with your health care provider.   Document Released: 04/09/2011 Document Revised: 02/05/2014 Document Reviewed: 03/10/2013 Elsevier Interactive Patient Education [employer].

## 2015-06-17 NOTE — ED Provider Notes (Signed)
CSN: [id number]     Arrival date & time 06/17/15  1336 History   First MD Initiated Contact with Patient 06/17/15 1445     No chief complaint on file.  (Consider location/radiation/quality/duration/timing/severity/associated sxs/prior Treatment) HPI History obtained from patient:  Pt presents with the cc [id number] hip pain Duration of symptoms:since last night Treatment prior to arrival:tylenol pm last night Context:slipped and fell in the kitchen last night Other symptoms include:left foot pain Pain score: FAMILY HISTORY:    Past Medical History  Diagnosis Date  . Macular degeneration   . Hypertension   . Hyperlipidemia   . SUI (stress urinary incontinence, female)   . Complication of anesthesia     VERY HIGH ANXIOUS  . Anxiety   . 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 History of chronic sinusitis    Past Surgical History  Procedure Laterality Date  . Orif ankle fx  2006    retained hardware  . Clavicle surgery    . Tonsillectomy  age 45's  . Appendectomy  age 42's  . Cystoscopy macroplastique implant N/A 01/07/2015    Procedure: CYSTOSCOPY MACROPLASTIQUE IMPLANT;  Surgeon: Bjorn Loser, MD;  Location: [facility];  Service: Urology;  Laterality: N/A;   No family history on file. Social History  Substance Use Topics  . Smoking status: Current Every Day Smoker -- 1.00 packs/day for 72 years    Types: Cigarettes  . Smokeless tobacco: Never Used  . Alcohol Use: Yes     Comment: rare   OB History    No data available     Review of Systems  Denies: HEADACHE, NAUSEA, ABDOMINAL PAIN, CHEST PAIN, CONGESTION, DYSURIA, SHORTNESS OF BREATH  Allergies  Shellfish allergy; Codeine; Oxycodone hcl; and Sulfa antibiotics  Home Medications   Prior to Admission medications   Medication Sig Start Date End Date Taking? Authorizing Provider  acetaminophen (TYLENOL) 325 MG tablet Take 650 mg by mouth every 6 (six) hours as needed. Headache or  pain    Historical Provider, MD  amLODipine (NORVASC) 5 MG tablet Take 5 mg by mouth daily.    Historical Provider, MD  aspirin EC 81 MG tablet Take 81 mg by mouth daily.    Historical Provider, MD  atenolol (TENORMIN) 50 MG tablet Take 50 mg by mouth daily.    Historical Provider, MD  atorvastatin (LIPITOR) 10 MG tablet Take 10 mg by mouth daily.    Historical Provider, MD  diphenhydramine-acetaminophen (TYLENOL PM EXTRA STRENGTH) 25-500 MG TABS Take 1 tablet by mouth at bedtime as needed. sleep    Historical Provider, MD  hydrALAZINE (APRESOLINE) 25 MG tablet Take 25 mg by mouth 2 (two) times daily.    Historical Provider, MD  hydrochlorothiazide (HYDRODIURIL) 25 MG tablet Take 25 mg by mouth daily.    Historical Provider, MD  Multiple Vitamin (MULITIVITAMIN WITH MINERALS) TABS Take 1 tablet by mouth daily.    Historical Provider, MD  potassium chloride SA (K-DUR,KLOR-CON) 20 MEQ tablet Take 20 mEq by mouth daily.    Historical Provider, MD   Meds Ordered and Administered this Visit  Medications - No data to display  BP 129/51 mmHg  Pulse 60  Temp(Src) 98.9 F (37.2 C) (Oral)  Resp 12  SpO2 96% No data found.   Physical Exam NURSES NOTES AND VITAL SIGNS REVIEWED. CONSTITUTIONAL: Well developed, well nourished, no acute distress HEENT: normocephalic, atraumatic EYES: Conjunctiva normal NECK:normal ROM, supple, no adenopathy PULMONARY:No respiratory distress,  normal effort ABDOMINAL: Soft, ND, NT BS+, No CVAT MUSCULOSKELETAL: Normal ROM of all extremities, point tenderness left SI joint. No bruise noted. SKIN: warm and dry without rash PSYCHIATRIC: Mood and affect, behavior are normal  ED Course  Procedures (including critical care time)  Labs Review Labs Reviewed - No data to display  Imaging Review Dg Hip Unilat With Pelvis 2-3 Views Left  06/17/2015  CLINICAL DATA:  Left hip pain after fall.  Initial encounter. EXAM: DG HIP (WITH OR WITHOUT PELVIS) 2-3V LEFT COMPARISON:   None. FINDINGS: There is no evidence of hip fracture or dislocation. There is no evidence of arthropathy or other focal bone abnormality. IMPRESSION: Normal left hip. Electronically Signed   By: Marijo Conception, M.D.   On: 06/17/2015 15:05  I HAVE PERSONALLY  REVIEWED AND DISCUSSED RESULTS OF  X-RAYS WITH PATIENT AND FAMILY PRIOR TO DISCHARGE.      Visual Acuity Review  Right Eye Distance:   Left Eye Distance:   Bilateral Distance:    Right Eye Near:   Left Eye Near:    Bilateral Near:         MDM   1. Hip pain, acute, left   2. Fall     Patient is reassured that there are no issues that require transfer to higher level of care at this time or additional tests. Patient is advised to continue home symptomatic treatment. Patient is advised that if there are new or worsening symptoms to attend the emergency department, contact primary care provider, or return to UC. Instructions of care provided discharged home in stable condition.    THIS NOTE WAS GENERATED USING A VOICE RECOGNITION SOFTWARE PROGRAM. ALL REASONABLE EFFORTS  WERE MADE TO PROOFREAD THIS DOCUMENT FOR ACCURACY.  I have verbally reviewed the discharge instructions with the patient. A printed AVS was given to the patient.  All questions were answered prior to discharge.      Konrad Felix, [place] 06/17/15 [phone]

## 2015-06-24 DIAGNOSIS — M545 Low back pain: Secondary | ICD-10-CM | POA: Diagnosis not present

## 2015-06-24 DIAGNOSIS — M25511 Pain in right shoulder: Secondary | ICD-10-CM | POA: Diagnosis not present

## 2015-07-26 DIAGNOSIS — N814 Uterovaginal prolapse, unspecified: Secondary | ICD-10-CM | POA: Diagnosis not present

## 2015-07-26 DIAGNOSIS — N8111 Cystocele, midline: Secondary | ICD-10-CM | POA: Diagnosis not present

## 2015-07-27 ENCOUNTER — Encounter (INDEPENDENT_AMBULATORY_CARE_PROVIDER_SITE_OTHER): Payer: Medicare Other | Admitting: Ophthalmology

## 2015-07-27 DIAGNOSIS — H353231 Exudative age-related macular degeneration, bilateral, with active choroidal neovascularization: Secondary | ICD-10-CM

## 2015-07-27 DIAGNOSIS — H2513 Age-related nuclear cataract, bilateral: Secondary | ICD-10-CM | POA: Diagnosis not present

## 2015-07-27 DIAGNOSIS — H43813 Vitreous degeneration, bilateral: Secondary | ICD-10-CM

## 2015-07-27 DIAGNOSIS — I10 Essential (primary) hypertension: Secondary | ICD-10-CM

## 2015-07-27 DIAGNOSIS — H35033 Hypertensive retinopathy, bilateral: Secondary | ICD-10-CM

## 2015-09-21 ENCOUNTER — Encounter (INDEPENDENT_AMBULATORY_CARE_PROVIDER_SITE_OTHER): Payer: Medicare Other | Admitting: Ophthalmology

## 2015-09-21 DIAGNOSIS — H2513 Age-related nuclear cataract, bilateral: Secondary | ICD-10-CM

## 2015-09-21 DIAGNOSIS — H353231 Exudative age-related macular degeneration, bilateral, with active choroidal neovascularization: Secondary | ICD-10-CM | POA: Diagnosis not present

## 2015-09-21 DIAGNOSIS — H43813 Vitreous degeneration, bilateral: Secondary | ICD-10-CM | POA: Diagnosis not present

## 2015-09-21 DIAGNOSIS — I10 Essential (primary) hypertension: Secondary | ICD-10-CM

## 2015-09-21 DIAGNOSIS — H35033 Hypertensive retinopathy, bilateral: Secondary | ICD-10-CM | POA: Diagnosis not present

## 2015-09-29 DIAGNOSIS — N8111 Cystocele, midline: Secondary | ICD-10-CM | POA: Diagnosis not present

## 2015-09-29 DIAGNOSIS — N814 Uterovaginal prolapse, unspecified: Secondary | ICD-10-CM | POA: Diagnosis not present

## 2015-10-04 DIAGNOSIS — R636 Underweight: Secondary | ICD-10-CM | POA: Diagnosis not present

## 2015-10-04 DIAGNOSIS — I10 Essential (primary) hypertension: Secondary | ICD-10-CM | POA: Diagnosis not present

## 2015-10-04 DIAGNOSIS — R634 Abnormal weight loss: Secondary | ICD-10-CM | POA: Diagnosis not present

## 2015-11-01 DIAGNOSIS — R636 Underweight: Secondary | ICD-10-CM | POA: Diagnosis not present

## 2015-11-01 DIAGNOSIS — I10 Essential (primary) hypertension: Secondary | ICD-10-CM | POA: Diagnosis not present

## 2015-11-29 DIAGNOSIS — N8111 Cystocele, midline: Secondary | ICD-10-CM | POA: Diagnosis not present

## 2015-11-29 DIAGNOSIS — N814 Uterovaginal prolapse, unspecified: Secondary | ICD-10-CM | POA: Diagnosis not present

## 2015-11-30 ENCOUNTER — Encounter (INDEPENDENT_AMBULATORY_CARE_PROVIDER_SITE_OTHER): Payer: Medicare Other | Admitting: Ophthalmology

## 2015-11-30 DIAGNOSIS — I10 Essential (primary) hypertension: Secondary | ICD-10-CM

## 2015-11-30 DIAGNOSIS — H353231 Exudative age-related macular degeneration, bilateral, with active choroidal neovascularization: Secondary | ICD-10-CM

## 2015-11-30 DIAGNOSIS — H35033 Hypertensive retinopathy, bilateral: Secondary | ICD-10-CM | POA: Diagnosis not present

## 2015-11-30 DIAGNOSIS — H43813 Vitreous degeneration, bilateral: Secondary | ICD-10-CM

## 2015-12-13 DIAGNOSIS — R634 Abnormal weight loss: Secondary | ICD-10-CM | POA: Diagnosis not present

## 2015-12-13 DIAGNOSIS — I10 Essential (primary) hypertension: Secondary | ICD-10-CM | POA: Diagnosis not present

## 2016-02-01 DIAGNOSIS — N814 Uterovaginal prolapse, unspecified: Secondary | ICD-10-CM | POA: Diagnosis not present

## 2016-02-01 DIAGNOSIS — N8111 Cystocele, midline: Secondary | ICD-10-CM | POA: Diagnosis not present

## 2016-03-07 ENCOUNTER — Encounter (INDEPENDENT_AMBULATORY_CARE_PROVIDER_SITE_OTHER): Payer: Medicare Other | Admitting: Ophthalmology

## 2016-03-07 DIAGNOSIS — H43813 Vitreous degeneration, bilateral: Secondary | ICD-10-CM

## 2016-03-07 DIAGNOSIS — H2513 Age-related nuclear cataract, bilateral: Secondary | ICD-10-CM | POA: Diagnosis not present

## 2016-03-07 DIAGNOSIS — H353231 Exudative age-related macular degeneration, bilateral, with active choroidal neovascularization: Secondary | ICD-10-CM

## 2016-03-07 DIAGNOSIS — H35033 Hypertensive retinopathy, bilateral: Secondary | ICD-10-CM | POA: Diagnosis not present

## 2016-03-07 DIAGNOSIS — I10 Essential (primary) hypertension: Secondary | ICD-10-CM

## 2016-04-02 DIAGNOSIS — N8111 Cystocele, midline: Secondary | ICD-10-CM | POA: Diagnosis not present

## 2016-04-02 DIAGNOSIS — N814 Uterovaginal prolapse, unspecified: Secondary | ICD-10-CM | POA: Diagnosis not present

## 2016-05-29 DIAGNOSIS — E7800 Pure hypercholesterolemia, unspecified: Secondary | ICD-10-CM | POA: Diagnosis not present

## 2016-05-29 DIAGNOSIS — I10 Essential (primary) hypertension: Secondary | ICD-10-CM | POA: Diagnosis not present

## 2016-05-29 DIAGNOSIS — E559 Vitamin D deficiency, unspecified: Secondary | ICD-10-CM | POA: Diagnosis not present

## 2016-05-29 DIAGNOSIS — Z0000 Encounter for general adult medical examination without abnormal findings: Secondary | ICD-10-CM | POA: Diagnosis not present

## 2016-06-01 DIAGNOSIS — L570 Actinic keratosis: Secondary | ICD-10-CM | POA: Diagnosis not present

## 2016-06-01 DIAGNOSIS — Z23 Encounter for immunization: Secondary | ICD-10-CM | POA: Diagnosis not present

## 2016-06-01 DIAGNOSIS — I672 Cerebral atherosclerosis: Secondary | ICD-10-CM | POA: Diagnosis not present

## 2016-06-01 DIAGNOSIS — I10 Essential (primary) hypertension: Secondary | ICD-10-CM | POA: Diagnosis not present

## 2016-06-01 DIAGNOSIS — E7800 Pure hypercholesterolemia, unspecified: Secondary | ICD-10-CM | POA: Diagnosis not present

## 2016-06-01 DIAGNOSIS — F17200 Nicotine dependence, unspecified, uncomplicated: Secondary | ICD-10-CM | POA: Diagnosis not present

## 2016-06-21 ENCOUNTER — Encounter (INDEPENDENT_AMBULATORY_CARE_PROVIDER_SITE_OTHER): Payer: Medicare Other | Admitting: Ophthalmology

## 2016-06-21 DIAGNOSIS — H2513 Age-related nuclear cataract, bilateral: Secondary | ICD-10-CM

## 2016-06-21 DIAGNOSIS — I10 Essential (primary) hypertension: Secondary | ICD-10-CM | POA: Diagnosis not present

## 2016-06-21 DIAGNOSIS — H43813 Vitreous degeneration, bilateral: Secondary | ICD-10-CM

## 2016-06-21 DIAGNOSIS — H35033 Hypertensive retinopathy, bilateral: Secondary | ICD-10-CM | POA: Diagnosis not present

## 2016-06-21 DIAGNOSIS — H353231 Exudative age-related macular degeneration, bilateral, with active choroidal neovascularization: Secondary | ICD-10-CM

## 2016-07-04 DIAGNOSIS — N814 Uterovaginal prolapse, unspecified: Secondary | ICD-10-CM | POA: Diagnosis not present

## 2016-07-04 DIAGNOSIS — N8111 Cystocele, midline: Secondary | ICD-10-CM | POA: Diagnosis not present

## 2016-07-16 DIAGNOSIS — R636 Underweight: Secondary | ICD-10-CM | POA: Diagnosis not present

## 2016-07-16 DIAGNOSIS — R634 Abnormal weight loss: Secondary | ICD-10-CM | POA: Diagnosis not present

## 2016-07-16 DIAGNOSIS — I10 Essential (primary) hypertension: Secondary | ICD-10-CM | POA: Diagnosis not present

## 2016-09-17 DIAGNOSIS — I10 Essential (primary) hypertension: Secondary | ICD-10-CM | POA: Diagnosis not present

## 2016-09-17 DIAGNOSIS — R636 Underweight: Secondary | ICD-10-CM | POA: Diagnosis not present

## 2016-10-03 DIAGNOSIS — N814 Uterovaginal prolapse, unspecified: Secondary | ICD-10-CM | POA: Diagnosis not present

## 2016-10-03 DIAGNOSIS — N8111 Cystocele, midline: Secondary | ICD-10-CM | POA: Diagnosis not present

## 2016-10-11 ENCOUNTER — Encounter (INDEPENDENT_AMBULATORY_CARE_PROVIDER_SITE_OTHER): Payer: Medicare Other | Admitting: Ophthalmology

## 2016-10-11 DIAGNOSIS — H43813 Vitreous degeneration, bilateral: Secondary | ICD-10-CM

## 2016-10-11 DIAGNOSIS — H35033 Hypertensive retinopathy, bilateral: Secondary | ICD-10-CM | POA: Diagnosis not present

## 2016-10-11 DIAGNOSIS — H353231 Exudative age-related macular degeneration, bilateral, with active choroidal neovascularization: Secondary | ICD-10-CM | POA: Diagnosis not present

## 2016-10-11 DIAGNOSIS — I10 Essential (primary) hypertension: Secondary | ICD-10-CM | POA: Diagnosis not present

## 2016-11-16 DIAGNOSIS — Z23 Encounter for immunization: Secondary | ICD-10-CM | POA: Diagnosis not present

## 2016-11-29 ENCOUNTER — Encounter: Payer: Medicare Other | Attending: Internal Medicine | Admitting: Registered"

## 2016-11-29 ENCOUNTER — Encounter: Payer: Self-pay | Admitting: Registered"

## 2016-11-29 DIAGNOSIS — Z713 Dietary counseling and surveillance: Secondary | ICD-10-CM | POA: Insufficient documentation

## 2016-11-29 DIAGNOSIS — R636 Underweight: Secondary | ICD-10-CM | POA: Insufficient documentation

## 2016-11-29 DIAGNOSIS — Z681 Body mass index (BMI) 19 or less, adult: Secondary | ICD-10-CM | POA: Diagnosis not present

## 2016-11-29 DIAGNOSIS — E639 Nutritional deficiency, unspecified: Secondary | ICD-10-CM

## 2016-11-29 DIAGNOSIS — R634 Abnormal weight loss: Secondary | ICD-10-CM | POA: Diagnosis not present

## 2016-11-29 NOTE — Progress Notes (Signed)
Medical Nutrition Therapy:  Appt start time: 1115 end time:  1200.  Assessment:  Primary concerns today: Pt states she is concerned because of continual weight loss. Pt reports no appetite, early satiety and forgets to eat. Patient states low confidence that there is anything she can do because she already knows what to eat.  Pt states she was advised to drink 2 high calorie boost a day but reports she couldn't drink that much, was too filling. Pt states she doesn't want to buy milk because she believes 1/2 gal is the smallest size and she doesn't want to waste it.  Pt states she did not want to try medication but agreed to try antidepressant to increase appetite, but experienced dizziness and stopped.   Pt states she her quality of life has been greatly diminished due to her reduced energy, and limited eye sight. Pt states she was a voracious reader and now has to listen to audible books which makes her want to sleep. Pt states she has lived alone for 40 years and has no interest in the group senior day care setting.   Preferred Learning Style: Auditory (due to limited eye sight)  Learning Readiness:   Not ready  Contemplating  MEDICATIONS: reviewed   DIETARY INTAKE:  24-hr recall:  B ( AM): OJ (for med) 1/2 bagel, cheese and tea  Snk ( AM): none L ( PM): none raisin bread, PB Snk ( PM): none D ( PM): eggs cooked in butter, 1/2 bagel Snk ( PM): none Beverages: water, tea  Usual physical activity: ADL (states she can't see so can't walk for exercise)  Estimated energy needs: 1600 calories 180 g carbohydrates 100 g protein 53 g fat  Nutritional Diagnosis:  NI-1.4 Inadequate energy intake As related to reduced appetite, early satiety, and loss of interest in eating.  As evidenced by dietary recall and BMI of 19.    Intervention:  Nutrition Education. Discussed sources for calorie-dense foods. Discussed changes in appetite and sense of taste which can happen with age. Discussed  options for foods that can be delivered   To Increase calorie tips: Cream cheese is an option to get more calories Mashed Potatoes - use butter, 1/2 & 1/2 cream, boiled small potatoes to make mashed potatoes Use sour cream, butter, oil whenever you can  Shake ideas to make in your blender Mix 1/2 Boost and 1/2 ice cream Whole milk, frozen fruit, ice cream Fairlife whole milk can find in a small (single serve) container.  Teaching Method Utilized:  Auditory  Handouts given during visit include:  High calorie/high protein tips  Barriers to learning/adherence to lifestyle change: limited eye sight and independence, reluctance to ask for help in cooking and shopping for groceries, aversion to wasting food.  Demonstrated degree of understanding via:  Teach Back   Monitoring/Evaluation:  Dietary intake, exercise, and body weight prn.

## 2016-11-29 NOTE — Patient Instructions (Addendum)
Cream cheese is an option to get more calories Mashed Potatoes - use butter, 1/2 & 1/2 cream, boiled small potatoes to make mashed potatoes Use sour cream, butter, oil whenever you can  Shake ideas to make in your blender Mix 1/2 Boost and 1/2 ice cream Whole milk, frozen fruit, ice cream [employer]life whole milk can find in a small container.

## 2016-12-17 DIAGNOSIS — R6881 Early satiety: Secondary | ICD-10-CM | POA: Diagnosis not present

## 2017-01-02 DIAGNOSIS — N814 Uterovaginal prolapse, unspecified: Secondary | ICD-10-CM | POA: Diagnosis not present

## 2017-01-02 DIAGNOSIS — N8111 Cystocele, midline: Secondary | ICD-10-CM | POA: Diagnosis not present

## 2017-02-14 ENCOUNTER — Encounter (INDEPENDENT_AMBULATORY_CARE_PROVIDER_SITE_OTHER): Payer: Medicare Other | Admitting: Ophthalmology

## 2017-02-14 DIAGNOSIS — H35033 Hypertensive retinopathy, bilateral: Secondary | ICD-10-CM

## 2017-02-14 DIAGNOSIS — H353231 Exudative age-related macular degeneration, bilateral, with active choroidal neovascularization: Secondary | ICD-10-CM

## 2017-02-14 DIAGNOSIS — H43813 Vitreous degeneration, bilateral: Secondary | ICD-10-CM | POA: Diagnosis not present

## 2017-02-14 DIAGNOSIS — H2513 Age-related nuclear cataract, bilateral: Secondary | ICD-10-CM

## 2017-02-14 DIAGNOSIS — I10 Essential (primary) hypertension: Secondary | ICD-10-CM

## 2017-03-18 DIAGNOSIS — R636 Underweight: Secondary | ICD-10-CM | POA: Diagnosis not present

## 2017-03-18 DIAGNOSIS — R6881 Early satiety: Secondary | ICD-10-CM | POA: Diagnosis not present

## 2017-04-03 DIAGNOSIS — N8111 Cystocele, midline: Secondary | ICD-10-CM | POA: Diagnosis not present

## 2017-04-03 DIAGNOSIS — N814 Uterovaginal prolapse, unspecified: Secondary | ICD-10-CM | POA: Diagnosis not present

## 2017-06-03 DIAGNOSIS — I10 Essential (primary) hypertension: Secondary | ICD-10-CM | POA: Diagnosis not present

## 2017-06-03 DIAGNOSIS — M810 Age-related osteoporosis without current pathological fracture: Secondary | ICD-10-CM | POA: Diagnosis not present

## 2017-06-03 DIAGNOSIS — E7800 Pure hypercholesterolemia, unspecified: Secondary | ICD-10-CM | POA: Diagnosis not present

## 2017-06-03 DIAGNOSIS — Z7982 Long term (current) use of aspirin: Secondary | ICD-10-CM | POA: Diagnosis not present

## 2017-06-03 DIAGNOSIS — E559 Vitamin D deficiency, unspecified: Secondary | ICD-10-CM | POA: Diagnosis not present

## 2017-06-06 DIAGNOSIS — Z7982 Long term (current) use of aspirin: Secondary | ICD-10-CM | POA: Diagnosis not present

## 2017-06-06 DIAGNOSIS — N183 Chronic kidney disease, stage 3 (moderate): Secondary | ICD-10-CM | POA: Diagnosis not present

## 2017-06-06 DIAGNOSIS — M810 Age-related osteoporosis without current pathological fracture: Secondary | ICD-10-CM | POA: Diagnosis not present

## 2017-06-06 DIAGNOSIS — I672 Cerebral atherosclerosis: Secondary | ICD-10-CM | POA: Diagnosis not present

## 2017-06-06 DIAGNOSIS — H3530 Unspecified macular degeneration: Secondary | ICD-10-CM | POA: Diagnosis not present

## 2017-06-06 DIAGNOSIS — R634 Abnormal weight loss: Secondary | ICD-10-CM | POA: Diagnosis not present

## 2017-06-06 DIAGNOSIS — R350 Frequency of micturition: Secondary | ICD-10-CM | POA: Diagnosis not present

## 2017-06-06 DIAGNOSIS — I10 Essential (primary) hypertension: Secondary | ICD-10-CM | POA: Diagnosis not present

## 2017-06-06 DIAGNOSIS — R636 Underweight: Secondary | ICD-10-CM | POA: Diagnosis not present

## 2017-06-06 DIAGNOSIS — Z0000 Encounter for general adult medical examination without abnormal findings: Secondary | ICD-10-CM | POA: Diagnosis not present

## 2017-06-06 DIAGNOSIS — E7800 Pure hypercholesterolemia, unspecified: Secondary | ICD-10-CM | POA: Diagnosis not present

## 2017-06-06 DIAGNOSIS — N8110 Cystocele, unspecified: Secondary | ICD-10-CM | POA: Diagnosis not present

## 2017-06-06 DIAGNOSIS — N390 Urinary tract infection, site not specified: Secondary | ICD-10-CM | POA: Diagnosis not present

## 2017-06-11 DIAGNOSIS — Z7982 Long term (current) use of aspirin: Secondary | ICD-10-CM | POA: Diagnosis not present

## 2017-06-11 DIAGNOSIS — H3530 Unspecified macular degeneration: Secondary | ICD-10-CM | POA: Diagnosis not present

## 2017-06-11 DIAGNOSIS — F17210 Nicotine dependence, cigarettes, uncomplicated: Secondary | ICD-10-CM | POA: Diagnosis not present

## 2017-06-11 DIAGNOSIS — M1710 Unilateral primary osteoarthritis, unspecified knee: Secondary | ICD-10-CM | POA: Diagnosis not present

## 2017-06-11 DIAGNOSIS — Z9181 History of falling: Secondary | ICD-10-CM | POA: Diagnosis not present

## 2017-06-11 DIAGNOSIS — I10 Essential (primary) hypertension: Secondary | ICD-10-CM | POA: Diagnosis not present

## 2017-06-11 DIAGNOSIS — R5381 Other malaise: Secondary | ICD-10-CM | POA: Diagnosis not present

## 2017-06-11 DIAGNOSIS — H547 Unspecified visual loss: Secondary | ICD-10-CM | POA: Diagnosis not present

## 2017-06-12 ENCOUNTER — Encounter (INDEPENDENT_AMBULATORY_CARE_PROVIDER_SITE_OTHER): Payer: Medicare Other | Admitting: Ophthalmology

## 2017-06-12 DIAGNOSIS — I10 Essential (primary) hypertension: Secondary | ICD-10-CM | POA: Diagnosis not present

## 2017-06-12 DIAGNOSIS — H35033 Hypertensive retinopathy, bilateral: Secondary | ICD-10-CM

## 2017-06-12 DIAGNOSIS — H43813 Vitreous degeneration, bilateral: Secondary | ICD-10-CM | POA: Diagnosis not present

## 2017-06-12 DIAGNOSIS — H353231 Exudative age-related macular degeneration, bilateral, with active choroidal neovascularization: Secondary | ICD-10-CM

## 2017-06-12 DIAGNOSIS — H2513 Age-related nuclear cataract, bilateral: Secondary | ICD-10-CM

## 2017-06-19 DIAGNOSIS — I10 Essential (primary) hypertension: Secondary | ICD-10-CM | POA: Diagnosis not present

## 2017-06-19 DIAGNOSIS — M1710 Unilateral primary osteoarthritis, unspecified knee: Secondary | ICD-10-CM | POA: Diagnosis not present

## 2017-06-19 DIAGNOSIS — R5381 Other malaise: Secondary | ICD-10-CM | POA: Diagnosis not present

## 2017-06-19 DIAGNOSIS — H547 Unspecified visual loss: Secondary | ICD-10-CM | POA: Diagnosis not present

## 2017-06-19 DIAGNOSIS — H3530 Unspecified macular degeneration: Secondary | ICD-10-CM | POA: Diagnosis not present

## 2017-06-19 DIAGNOSIS — F17210 Nicotine dependence, cigarettes, uncomplicated: Secondary | ICD-10-CM | POA: Diagnosis not present

## 2017-06-21 DIAGNOSIS — H547 Unspecified visual loss: Secondary | ICD-10-CM | POA: Diagnosis not present

## 2017-06-21 DIAGNOSIS — M1710 Unilateral primary osteoarthritis, unspecified knee: Secondary | ICD-10-CM | POA: Diagnosis not present

## 2017-06-21 DIAGNOSIS — H3530 Unspecified macular degeneration: Secondary | ICD-10-CM | POA: Diagnosis not present

## 2017-06-21 DIAGNOSIS — F17210 Nicotine dependence, cigarettes, uncomplicated: Secondary | ICD-10-CM | POA: Diagnosis not present

## 2017-06-21 DIAGNOSIS — I10 Essential (primary) hypertension: Secondary | ICD-10-CM | POA: Diagnosis not present

## 2017-06-21 DIAGNOSIS — R5381 Other malaise: Secondary | ICD-10-CM | POA: Diagnosis not present

## 2017-06-25 DIAGNOSIS — M1710 Unilateral primary osteoarthritis, unspecified knee: Secondary | ICD-10-CM | POA: Diagnosis not present

## 2017-06-25 DIAGNOSIS — I10 Essential (primary) hypertension: Secondary | ICD-10-CM | POA: Diagnosis not present

## 2017-06-25 DIAGNOSIS — H3530 Unspecified macular degeneration: Secondary | ICD-10-CM | POA: Diagnosis not present

## 2017-06-25 DIAGNOSIS — R5381 Other malaise: Secondary | ICD-10-CM | POA: Diagnosis not present

## 2017-06-25 DIAGNOSIS — H547 Unspecified visual loss: Secondary | ICD-10-CM | POA: Diagnosis not present

## 2017-06-25 DIAGNOSIS — F17210 Nicotine dependence, cigarettes, uncomplicated: Secondary | ICD-10-CM | POA: Diagnosis not present

## 2017-06-26 DIAGNOSIS — R5381 Other malaise: Secondary | ICD-10-CM | POA: Diagnosis not present

## 2017-06-26 DIAGNOSIS — F17210 Nicotine dependence, cigarettes, uncomplicated: Secondary | ICD-10-CM | POA: Diagnosis not present

## 2017-06-26 DIAGNOSIS — I10 Essential (primary) hypertension: Secondary | ICD-10-CM | POA: Diagnosis not present

## 2017-06-26 DIAGNOSIS — H547 Unspecified visual loss: Secondary | ICD-10-CM | POA: Diagnosis not present

## 2017-06-26 DIAGNOSIS — H3530 Unspecified macular degeneration: Secondary | ICD-10-CM | POA: Diagnosis not present

## 2017-06-26 DIAGNOSIS — M1710 Unilateral primary osteoarthritis, unspecified knee: Secondary | ICD-10-CM | POA: Diagnosis not present

## 2017-06-27 DIAGNOSIS — M1710 Unilateral primary osteoarthritis, unspecified knee: Secondary | ICD-10-CM | POA: Diagnosis not present

## 2017-06-27 DIAGNOSIS — H547 Unspecified visual loss: Secondary | ICD-10-CM | POA: Diagnosis not present

## 2017-06-27 DIAGNOSIS — I10 Essential (primary) hypertension: Secondary | ICD-10-CM | POA: Diagnosis not present

## 2017-06-27 DIAGNOSIS — R5381 Other malaise: Secondary | ICD-10-CM | POA: Diagnosis not present

## 2017-06-27 DIAGNOSIS — H3530 Unspecified macular degeneration: Secondary | ICD-10-CM | POA: Diagnosis not present

## 2017-06-27 DIAGNOSIS — F17210 Nicotine dependence, cigarettes, uncomplicated: Secondary | ICD-10-CM | POA: Diagnosis not present

## 2017-07-01 DIAGNOSIS — R5381 Other malaise: Secondary | ICD-10-CM | POA: Diagnosis not present

## 2017-07-01 DIAGNOSIS — M1710 Unilateral primary osteoarthritis, unspecified knee: Secondary | ICD-10-CM | POA: Diagnosis not present

## 2017-07-01 DIAGNOSIS — F17210 Nicotine dependence, cigarettes, uncomplicated: Secondary | ICD-10-CM | POA: Diagnosis not present

## 2017-07-01 DIAGNOSIS — I10 Essential (primary) hypertension: Secondary | ICD-10-CM | POA: Diagnosis not present

## 2017-07-01 DIAGNOSIS — H547 Unspecified visual loss: Secondary | ICD-10-CM | POA: Diagnosis not present

## 2017-07-01 DIAGNOSIS — H3530 Unspecified macular degeneration: Secondary | ICD-10-CM | POA: Diagnosis not present

## 2017-07-04 DIAGNOSIS — H3530 Unspecified macular degeneration: Secondary | ICD-10-CM | POA: Diagnosis not present

## 2017-07-04 DIAGNOSIS — M1710 Unilateral primary osteoarthritis, unspecified knee: Secondary | ICD-10-CM | POA: Diagnosis not present

## 2017-07-04 DIAGNOSIS — R5381 Other malaise: Secondary | ICD-10-CM | POA: Diagnosis not present

## 2017-07-04 DIAGNOSIS — F17210 Nicotine dependence, cigarettes, uncomplicated: Secondary | ICD-10-CM | POA: Diagnosis not present

## 2017-07-04 DIAGNOSIS — I10 Essential (primary) hypertension: Secondary | ICD-10-CM | POA: Diagnosis not present

## 2017-07-04 DIAGNOSIS — H547 Unspecified visual loss: Secondary | ICD-10-CM | POA: Diagnosis not present

## 2017-07-08 DIAGNOSIS — R42 Dizziness and giddiness: Secondary | ICD-10-CM | POA: Diagnosis not present

## 2017-07-08 DIAGNOSIS — I959 Hypotension, unspecified: Secondary | ICD-10-CM | POA: Diagnosis not present

## 2017-07-08 DIAGNOSIS — M1710 Unilateral primary osteoarthritis, unspecified knee: Secondary | ICD-10-CM | POA: Diagnosis not present

## 2017-07-08 DIAGNOSIS — F17210 Nicotine dependence, cigarettes, uncomplicated: Secondary | ICD-10-CM | POA: Diagnosis not present

## 2017-07-08 DIAGNOSIS — H547 Unspecified visual loss: Secondary | ICD-10-CM | POA: Diagnosis not present

## 2017-07-08 DIAGNOSIS — R5381 Other malaise: Secondary | ICD-10-CM | POA: Diagnosis not present

## 2017-07-08 DIAGNOSIS — H3530 Unspecified macular degeneration: Secondary | ICD-10-CM | POA: Diagnosis not present

## 2017-07-08 DIAGNOSIS — I10 Essential (primary) hypertension: Secondary | ICD-10-CM | POA: Diagnosis not present

## 2017-07-10 DIAGNOSIS — N814 Uterovaginal prolapse, unspecified: Secondary | ICD-10-CM | POA: Diagnosis not present

## 2017-07-10 DIAGNOSIS — N8111 Cystocele, midline: Secondary | ICD-10-CM | POA: Diagnosis not present

## 2017-07-11 DIAGNOSIS — M1710 Unilateral primary osteoarthritis, unspecified knee: Secondary | ICD-10-CM | POA: Diagnosis not present

## 2017-07-11 DIAGNOSIS — R5381 Other malaise: Secondary | ICD-10-CM | POA: Diagnosis not present

## 2017-07-11 DIAGNOSIS — H3530 Unspecified macular degeneration: Secondary | ICD-10-CM | POA: Diagnosis not present

## 2017-07-11 DIAGNOSIS — I10 Essential (primary) hypertension: Secondary | ICD-10-CM | POA: Diagnosis not present

## 2017-07-11 DIAGNOSIS — H547 Unspecified visual loss: Secondary | ICD-10-CM | POA: Diagnosis not present

## 2017-07-11 DIAGNOSIS — F17210 Nicotine dependence, cigarettes, uncomplicated: Secondary | ICD-10-CM | POA: Diagnosis not present

## 2017-07-15 DIAGNOSIS — F17210 Nicotine dependence, cigarettes, uncomplicated: Secondary | ICD-10-CM | POA: Diagnosis not present

## 2017-07-15 DIAGNOSIS — I10 Essential (primary) hypertension: Secondary | ICD-10-CM | POA: Diagnosis not present

## 2017-07-15 DIAGNOSIS — M1710 Unilateral primary osteoarthritis, unspecified knee: Secondary | ICD-10-CM | POA: Diagnosis not present

## 2017-07-15 DIAGNOSIS — H3530 Unspecified macular degeneration: Secondary | ICD-10-CM | POA: Diagnosis not present

## 2017-07-15 DIAGNOSIS — R5381 Other malaise: Secondary | ICD-10-CM | POA: Diagnosis not present

## 2017-07-15 DIAGNOSIS — H547 Unspecified visual loss: Secondary | ICD-10-CM | POA: Diagnosis not present

## 2017-07-16 DIAGNOSIS — F17210 Nicotine dependence, cigarettes, uncomplicated: Secondary | ICD-10-CM | POA: Diagnosis not present

## 2017-07-16 DIAGNOSIS — M1710 Unilateral primary osteoarthritis, unspecified knee: Secondary | ICD-10-CM | POA: Diagnosis not present

## 2017-07-16 DIAGNOSIS — I10 Essential (primary) hypertension: Secondary | ICD-10-CM | POA: Diagnosis not present

## 2017-07-16 DIAGNOSIS — H547 Unspecified visual loss: Secondary | ICD-10-CM | POA: Diagnosis not present

## 2017-07-16 DIAGNOSIS — R5381 Other malaise: Secondary | ICD-10-CM | POA: Diagnosis not present

## 2017-07-16 DIAGNOSIS — H3530 Unspecified macular degeneration: Secondary | ICD-10-CM | POA: Diagnosis not present

## 2017-07-22 DIAGNOSIS — H3530 Unspecified macular degeneration: Secondary | ICD-10-CM | POA: Diagnosis not present

## 2017-07-22 DIAGNOSIS — R5381 Other malaise: Secondary | ICD-10-CM | POA: Diagnosis not present

## 2017-07-22 DIAGNOSIS — M1710 Unilateral primary osteoarthritis, unspecified knee: Secondary | ICD-10-CM | POA: Diagnosis not present

## 2017-07-22 DIAGNOSIS — F17210 Nicotine dependence, cigarettes, uncomplicated: Secondary | ICD-10-CM | POA: Diagnosis not present

## 2017-07-22 DIAGNOSIS — I10 Essential (primary) hypertension: Secondary | ICD-10-CM | POA: Diagnosis not present

## 2017-07-22 DIAGNOSIS — H547 Unspecified visual loss: Secondary | ICD-10-CM | POA: Diagnosis not present

## 2017-07-24 DIAGNOSIS — I10 Essential (primary) hypertension: Secondary | ICD-10-CM | POA: Diagnosis not present

## 2017-07-24 DIAGNOSIS — R5381 Other malaise: Secondary | ICD-10-CM | POA: Diagnosis not present

## 2017-07-24 DIAGNOSIS — H547 Unspecified visual loss: Secondary | ICD-10-CM | POA: Diagnosis not present

## 2017-07-24 DIAGNOSIS — H3530 Unspecified macular degeneration: Secondary | ICD-10-CM | POA: Diagnosis not present

## 2017-07-24 DIAGNOSIS — M1710 Unilateral primary osteoarthritis, unspecified knee: Secondary | ICD-10-CM | POA: Diagnosis not present

## 2017-07-24 DIAGNOSIS — F17210 Nicotine dependence, cigarettes, uncomplicated: Secondary | ICD-10-CM | POA: Diagnosis not present

## 2017-07-29 DIAGNOSIS — H3530 Unspecified macular degeneration: Secondary | ICD-10-CM | POA: Diagnosis not present

## 2017-07-29 DIAGNOSIS — I10 Essential (primary) hypertension: Secondary | ICD-10-CM | POA: Diagnosis not present

## 2017-07-29 DIAGNOSIS — M1710 Unilateral primary osteoarthritis, unspecified knee: Secondary | ICD-10-CM | POA: Diagnosis not present

## 2017-07-29 DIAGNOSIS — H547 Unspecified visual loss: Secondary | ICD-10-CM | POA: Diagnosis not present

## 2017-07-29 DIAGNOSIS — F17210 Nicotine dependence, cigarettes, uncomplicated: Secondary | ICD-10-CM | POA: Diagnosis not present

## 2017-07-29 DIAGNOSIS — R5381 Other malaise: Secondary | ICD-10-CM | POA: Diagnosis not present

## 2017-07-31 DIAGNOSIS — H3530 Unspecified macular degeneration: Secondary | ICD-10-CM | POA: Diagnosis not present

## 2017-07-31 DIAGNOSIS — H547 Unspecified visual loss: Secondary | ICD-10-CM | POA: Diagnosis not present

## 2017-07-31 DIAGNOSIS — M1710 Unilateral primary osteoarthritis, unspecified knee: Secondary | ICD-10-CM | POA: Diagnosis not present

## 2017-07-31 DIAGNOSIS — R5381 Other malaise: Secondary | ICD-10-CM | POA: Diagnosis not present

## 2017-07-31 DIAGNOSIS — F17210 Nicotine dependence, cigarettes, uncomplicated: Secondary | ICD-10-CM | POA: Diagnosis not present

## 2017-07-31 DIAGNOSIS — I10 Essential (primary) hypertension: Secondary | ICD-10-CM | POA: Diagnosis not present

## 2017-08-05 DIAGNOSIS — I10 Essential (primary) hypertension: Secondary | ICD-10-CM | POA: Diagnosis not present

## 2017-10-09 DIAGNOSIS — N8111 Cystocele, midline: Secondary | ICD-10-CM | POA: Diagnosis not present

## 2017-10-09 DIAGNOSIS — N814 Uterovaginal prolapse, unspecified: Secondary | ICD-10-CM | POA: Diagnosis not present

## 2017-10-23 ENCOUNTER — Encounter (INDEPENDENT_AMBULATORY_CARE_PROVIDER_SITE_OTHER): Payer: Medicare Other | Admitting: Ophthalmology

## 2017-10-23 DIAGNOSIS — H35033 Hypertensive retinopathy, bilateral: Secondary | ICD-10-CM

## 2017-10-23 DIAGNOSIS — H353231 Exudative age-related macular degeneration, bilateral, with active choroidal neovascularization: Secondary | ICD-10-CM | POA: Diagnosis not present

## 2017-10-23 DIAGNOSIS — H43813 Vitreous degeneration, bilateral: Secondary | ICD-10-CM | POA: Diagnosis not present

## 2017-10-23 DIAGNOSIS — I10 Essential (primary) hypertension: Secondary | ICD-10-CM | POA: Diagnosis not present

## 2017-10-27 DIAGNOSIS — Z23 Encounter for immunization: Secondary | ICD-10-CM | POA: Diagnosis not present

## 2017-11-28 ENCOUNTER — Encounter (INDEPENDENT_AMBULATORY_CARE_PROVIDER_SITE_OTHER): Payer: Medicare Other | Admitting: Ophthalmology

## 2017-11-28 DIAGNOSIS — I10 Essential (primary) hypertension: Secondary | ICD-10-CM

## 2017-11-28 DIAGNOSIS — H2513 Age-related nuclear cataract, bilateral: Secondary | ICD-10-CM | POA: Diagnosis not present

## 2017-11-28 DIAGNOSIS — H43813 Vitreous degeneration, bilateral: Secondary | ICD-10-CM | POA: Diagnosis not present

## 2017-11-28 DIAGNOSIS — H353231 Exudative age-related macular degeneration, bilateral, with active choroidal neovascularization: Secondary | ICD-10-CM

## 2017-11-28 DIAGNOSIS — H35033 Hypertensive retinopathy, bilateral: Secondary | ICD-10-CM | POA: Diagnosis not present

## 2018-01-01 DIAGNOSIS — N814 Uterovaginal prolapse, unspecified: Secondary | ICD-10-CM | POA: Diagnosis not present

## 2018-01-01 DIAGNOSIS — N8111 Cystocele, midline: Secondary | ICD-10-CM | POA: Diagnosis not present

## 2018-01-02 ENCOUNTER — Encounter (INDEPENDENT_AMBULATORY_CARE_PROVIDER_SITE_OTHER): Payer: Medicare Other | Admitting: Ophthalmology

## 2018-01-02 DIAGNOSIS — I10 Essential (primary) hypertension: Secondary | ICD-10-CM

## 2018-01-02 DIAGNOSIS — H353231 Exudative age-related macular degeneration, bilateral, with active choroidal neovascularization: Secondary | ICD-10-CM | POA: Diagnosis not present

## 2018-01-02 DIAGNOSIS — H35033 Hypertensive retinopathy, bilateral: Secondary | ICD-10-CM

## 2018-01-02 DIAGNOSIS — H43813 Vitreous degeneration, bilateral: Secondary | ICD-10-CM

## 2018-02-03 ENCOUNTER — Encounter (INDEPENDENT_AMBULATORY_CARE_PROVIDER_SITE_OTHER): Payer: Medicare Other | Admitting: Ophthalmology

## 2018-02-03 DIAGNOSIS — H2513 Age-related nuclear cataract, bilateral: Secondary | ICD-10-CM | POA: Diagnosis not present

## 2018-02-03 DIAGNOSIS — H43813 Vitreous degeneration, bilateral: Secondary | ICD-10-CM | POA: Diagnosis not present

## 2018-02-03 DIAGNOSIS — I10 Essential (primary) hypertension: Secondary | ICD-10-CM | POA: Diagnosis not present

## 2018-02-03 DIAGNOSIS — H353231 Exudative age-related macular degeneration, bilateral, with active choroidal neovascularization: Secondary | ICD-10-CM

## 2018-02-03 DIAGNOSIS — H35033 Hypertensive retinopathy, bilateral: Secondary | ICD-10-CM

## 2018-03-10 ENCOUNTER — Encounter (INDEPENDENT_AMBULATORY_CARE_PROVIDER_SITE_OTHER): Payer: Medicare Other | Admitting: Ophthalmology

## 2018-03-10 DIAGNOSIS — H2513 Age-related nuclear cataract, bilateral: Secondary | ICD-10-CM

## 2018-03-10 DIAGNOSIS — H43813 Vitreous degeneration, bilateral: Secondary | ICD-10-CM | POA: Diagnosis not present

## 2018-03-10 DIAGNOSIS — H35033 Hypertensive retinopathy, bilateral: Secondary | ICD-10-CM | POA: Diagnosis not present

## 2018-03-10 DIAGNOSIS — I10 Essential (primary) hypertension: Secondary | ICD-10-CM

## 2018-03-10 DIAGNOSIS — H353231 Exudative age-related macular degeneration, bilateral, with active choroidal neovascularization: Secondary | ICD-10-CM

## 2018-04-02 DIAGNOSIS — N8111 Cystocele, midline: Secondary | ICD-10-CM | POA: Diagnosis not present

## 2018-04-02 DIAGNOSIS — N814 Uterovaginal prolapse, unspecified: Secondary | ICD-10-CM | POA: Diagnosis not present

## 2018-04-24 ENCOUNTER — Encounter (INDEPENDENT_AMBULATORY_CARE_PROVIDER_SITE_OTHER): Payer: Medicare Other | Admitting: Ophthalmology

## 2018-04-30 ENCOUNTER — Encounter (INDEPENDENT_AMBULATORY_CARE_PROVIDER_SITE_OTHER): Payer: Medicare Other | Admitting: Ophthalmology

## 2018-06-12 ENCOUNTER — Encounter (INDEPENDENT_AMBULATORY_CARE_PROVIDER_SITE_OTHER): Payer: Medicare Other | Admitting: Ophthalmology

## 2018-07-02 DIAGNOSIS — N814 Uterovaginal prolapse, unspecified: Secondary | ICD-10-CM | POA: Diagnosis not present

## 2018-07-02 DIAGNOSIS — N8111 Cystocele, midline: Secondary | ICD-10-CM | POA: Diagnosis not present

## 2018-07-09 ENCOUNTER — Other Ambulatory Visit: Payer: Self-pay

## 2018-07-09 ENCOUNTER — Encounter (INDEPENDENT_AMBULATORY_CARE_PROVIDER_SITE_OTHER): Payer: Medicare Other | Admitting: Ophthalmology

## 2018-07-09 DIAGNOSIS — H353231 Exudative age-related macular degeneration, bilateral, with active choroidal neovascularization: Secondary | ICD-10-CM | POA: Diagnosis not present

## 2018-07-09 DIAGNOSIS — H35033 Hypertensive retinopathy, bilateral: Secondary | ICD-10-CM

## 2018-07-09 DIAGNOSIS — I10 Essential (primary) hypertension: Secondary | ICD-10-CM

## 2018-07-09 DIAGNOSIS — H43813 Vitreous degeneration, bilateral: Secondary | ICD-10-CM | POA: Diagnosis not present

## 2018-09-10 ENCOUNTER — Encounter (INDEPENDENT_AMBULATORY_CARE_PROVIDER_SITE_OTHER): Payer: Medicare Other | Admitting: Ophthalmology

## 2018-09-10 ENCOUNTER — Other Ambulatory Visit: Payer: Self-pay

## 2018-09-10 DIAGNOSIS — H2513 Age-related nuclear cataract, bilateral: Secondary | ICD-10-CM

## 2018-09-10 DIAGNOSIS — H43813 Vitreous degeneration, bilateral: Secondary | ICD-10-CM

## 2018-09-10 DIAGNOSIS — H353231 Exudative age-related macular degeneration, bilateral, with active choroidal neovascularization: Secondary | ICD-10-CM | POA: Diagnosis not present

## 2018-09-10 DIAGNOSIS — I10 Essential (primary) hypertension: Secondary | ICD-10-CM

## 2018-09-10 DIAGNOSIS — H35033 Hypertensive retinopathy, bilateral: Secondary | ICD-10-CM

## 2018-09-24 ENCOUNTER — Encounter (INDEPENDENT_AMBULATORY_CARE_PROVIDER_SITE_OTHER): Payer: Medicare Other | Admitting: Ophthalmology

## 2018-09-26 DIAGNOSIS — Z23 Encounter for immunization: Secondary | ICD-10-CM | POA: Diagnosis not present

## 2018-09-29 DIAGNOSIS — H01002 Unspecified blepharitis right lower eyelid: Secondary | ICD-10-CM | POA: Diagnosis not present

## 2018-09-29 DIAGNOSIS — H01005 Unspecified blepharitis left lower eyelid: Secondary | ICD-10-CM | POA: Diagnosis not present

## 2018-10-03 DIAGNOSIS — N8111 Cystocele, midline: Secondary | ICD-10-CM | POA: Diagnosis not present

## 2018-10-03 DIAGNOSIS — N814 Uterovaginal prolapse, unspecified: Secondary | ICD-10-CM | POA: Diagnosis not present

## 2018-10-13 ENCOUNTER — Encounter (INDEPENDENT_AMBULATORY_CARE_PROVIDER_SITE_OTHER): Payer: Medicare Other | Admitting: Ophthalmology

## 2018-10-13 ENCOUNTER — Other Ambulatory Visit: Payer: Self-pay

## 2018-10-13 DIAGNOSIS — H353231 Exudative age-related macular degeneration, bilateral, with active choroidal neovascularization: Secondary | ICD-10-CM | POA: Diagnosis not present

## 2018-10-13 DIAGNOSIS — H43813 Vitreous degeneration, bilateral: Secondary | ICD-10-CM | POA: Diagnosis not present

## 2018-10-13 DIAGNOSIS — H2513 Age-related nuclear cataract, bilateral: Secondary | ICD-10-CM

## 2018-10-13 DIAGNOSIS — H35033 Hypertensive retinopathy, bilateral: Secondary | ICD-10-CM | POA: Diagnosis not present

## 2018-10-13 DIAGNOSIS — I10 Essential (primary) hypertension: Secondary | ICD-10-CM | POA: Diagnosis not present

## 2018-10-23 DIAGNOSIS — I10 Essential (primary) hypertension: Secondary | ICD-10-CM | POA: Diagnosis not present

## 2018-10-23 DIAGNOSIS — E7800 Pure hypercholesterolemia, unspecified: Secondary | ICD-10-CM | POA: Diagnosis not present

## 2018-10-23 DIAGNOSIS — M810 Age-related osteoporosis without current pathological fracture: Secondary | ICD-10-CM | POA: Diagnosis not present

## 2018-10-30 DIAGNOSIS — Z789 Other specified health status: Secondary | ICD-10-CM | POA: Diagnosis not present

## 2018-10-30 DIAGNOSIS — Z91013 Allergy to seafood: Secondary | ICD-10-CM | POA: Diagnosis not present

## 2018-10-30 DIAGNOSIS — I10 Essential (primary) hypertension: Secondary | ICD-10-CM | POA: Diagnosis not present

## 2018-10-30 DIAGNOSIS — R636 Underweight: Secondary | ICD-10-CM | POA: Diagnosis not present

## 2018-10-30 DIAGNOSIS — F17200 Nicotine dependence, unspecified, uncomplicated: Secondary | ICD-10-CM | POA: Diagnosis not present

## 2018-10-30 DIAGNOSIS — I672 Cerebral atherosclerosis: Secondary | ICD-10-CM | POA: Diagnosis not present

## 2018-10-30 DIAGNOSIS — H3530 Unspecified macular degeneration: Secondary | ICD-10-CM | POA: Diagnosis not present

## 2018-10-30 DIAGNOSIS — N183 Chronic kidney disease, stage 3 unspecified: Secondary | ICD-10-CM | POA: Diagnosis not present

## 2018-10-30 DIAGNOSIS — Z0000 Encounter for general adult medical examination without abnormal findings: Secondary | ICD-10-CM | POA: Diagnosis not present

## 2018-10-30 DIAGNOSIS — R413 Other amnesia: Secondary | ICD-10-CM | POA: Diagnosis not present

## 2018-11-10 ENCOUNTER — Encounter (INDEPENDENT_AMBULATORY_CARE_PROVIDER_SITE_OTHER): Payer: Medicare Other | Admitting: Ophthalmology

## 2018-11-17 ENCOUNTER — Encounter (INDEPENDENT_AMBULATORY_CARE_PROVIDER_SITE_OTHER): Payer: Medicare Other | Admitting: Ophthalmology

## 2018-11-17 ENCOUNTER — Other Ambulatory Visit: Payer: Self-pay

## 2018-11-17 DIAGNOSIS — H35033 Hypertensive retinopathy, bilateral: Secondary | ICD-10-CM

## 2018-11-17 DIAGNOSIS — I10 Essential (primary) hypertension: Secondary | ICD-10-CM | POA: Diagnosis not present

## 2018-11-17 DIAGNOSIS — H353231 Exudative age-related macular degeneration, bilateral, with active choroidal neovascularization: Secondary | ICD-10-CM

## 2018-11-17 DIAGNOSIS — H43813 Vitreous degeneration, bilateral: Secondary | ICD-10-CM

## 2018-12-15 ENCOUNTER — Encounter (INDEPENDENT_AMBULATORY_CARE_PROVIDER_SITE_OTHER): Payer: Medicare Other | Admitting: Ophthalmology

## 2018-12-18 ENCOUNTER — Encounter (INDEPENDENT_AMBULATORY_CARE_PROVIDER_SITE_OTHER): Payer: Medicare Other | Admitting: Ophthalmology

## 2018-12-23 ENCOUNTER — Encounter (INDEPENDENT_AMBULATORY_CARE_PROVIDER_SITE_OTHER): Payer: Medicare Other | Admitting: Ophthalmology

## 2018-12-23 DIAGNOSIS — H35033 Hypertensive retinopathy, bilateral: Secondary | ICD-10-CM

## 2018-12-23 DIAGNOSIS — H43813 Vitreous degeneration, bilateral: Secondary | ICD-10-CM | POA: Diagnosis not present

## 2018-12-23 DIAGNOSIS — H353231 Exudative age-related macular degeneration, bilateral, with active choroidal neovascularization: Secondary | ICD-10-CM

## 2018-12-23 DIAGNOSIS — I10 Essential (primary) hypertension: Secondary | ICD-10-CM | POA: Diagnosis not present

## 2018-12-23 DIAGNOSIS — H2513 Age-related nuclear cataract, bilateral: Secondary | ICD-10-CM | POA: Diagnosis not present

## 2018-12-24 ENCOUNTER — Other Ambulatory Visit: Payer: Self-pay

## 2018-12-31 DIAGNOSIS — N814 Uterovaginal prolapse, unspecified: Secondary | ICD-10-CM | POA: Diagnosis not present

## 2018-12-31 DIAGNOSIS — N8111 Cystocele, midline: Secondary | ICD-10-CM | POA: Diagnosis not present

## 2019-01-20 ENCOUNTER — Encounter (INDEPENDENT_AMBULATORY_CARE_PROVIDER_SITE_OTHER): Payer: Medicare Other | Admitting: Ophthalmology

## 2019-01-20 DIAGNOSIS — H353231 Exudative age-related macular degeneration, bilateral, with active choroidal neovascularization: Secondary | ICD-10-CM

## 2019-01-20 DIAGNOSIS — H43813 Vitreous degeneration, bilateral: Secondary | ICD-10-CM | POA: Diagnosis not present

## 2019-01-20 DIAGNOSIS — H35033 Hypertensive retinopathy, bilateral: Secondary | ICD-10-CM | POA: Diagnosis not present

## 2019-01-20 DIAGNOSIS — I10 Essential (primary) hypertension: Secondary | ICD-10-CM

## 2019-02-23 ENCOUNTER — Encounter (INDEPENDENT_AMBULATORY_CARE_PROVIDER_SITE_OTHER): Payer: Medicare Other | Admitting: Ophthalmology

## 2019-03-03 ENCOUNTER — Encounter (INDEPENDENT_AMBULATORY_CARE_PROVIDER_SITE_OTHER): Payer: Medicare Other | Admitting: Ophthalmology

## 2019-03-03 DIAGNOSIS — H353231 Exudative age-related macular degeneration, bilateral, with active choroidal neovascularization: Secondary | ICD-10-CM | POA: Diagnosis not present

## 2019-03-03 DIAGNOSIS — I10 Essential (primary) hypertension: Secondary | ICD-10-CM | POA: Diagnosis not present

## 2019-03-03 DIAGNOSIS — H35033 Hypertensive retinopathy, bilateral: Secondary | ICD-10-CM | POA: Diagnosis not present

## 2019-03-03 DIAGNOSIS — H43813 Vitreous degeneration, bilateral: Secondary | ICD-10-CM

## 2019-03-31 DIAGNOSIS — N8111 Cystocele, midline: Secondary | ICD-10-CM | POA: Diagnosis not present

## 2019-03-31 DIAGNOSIS — N814 Uterovaginal prolapse, unspecified: Secondary | ICD-10-CM | POA: Diagnosis not present

## 2019-04-08 ENCOUNTER — Encounter (INDEPENDENT_AMBULATORY_CARE_PROVIDER_SITE_OTHER): Payer: Medicare Other | Admitting: Ophthalmology

## 2019-04-08 DIAGNOSIS — H2513 Age-related nuclear cataract, bilateral: Secondary | ICD-10-CM

## 2019-04-08 DIAGNOSIS — I10 Essential (primary) hypertension: Secondary | ICD-10-CM

## 2019-04-08 DIAGNOSIS — H43813 Vitreous degeneration, bilateral: Secondary | ICD-10-CM | POA: Diagnosis not present

## 2019-04-08 DIAGNOSIS — H35033 Hypertensive retinopathy, bilateral: Secondary | ICD-10-CM

## 2019-04-08 DIAGNOSIS — H353231 Exudative age-related macular degeneration, bilateral, with active choroidal neovascularization: Secondary | ICD-10-CM | POA: Diagnosis not present

## 2019-04-16 ENCOUNTER — Encounter (INDEPENDENT_AMBULATORY_CARE_PROVIDER_SITE_OTHER): Payer: Medicare Other | Admitting: Ophthalmology

## 2019-06-03 ENCOUNTER — Encounter (INDEPENDENT_AMBULATORY_CARE_PROVIDER_SITE_OTHER): Payer: Medicare Other | Admitting: Ophthalmology

## 2019-06-09 ENCOUNTER — Other Ambulatory Visit: Payer: Self-pay

## 2019-06-09 ENCOUNTER — Encounter (INDEPENDENT_AMBULATORY_CARE_PROVIDER_SITE_OTHER): Payer: Medicare Other | Admitting: Ophthalmology

## 2019-06-09 DIAGNOSIS — H35033 Hypertensive retinopathy, bilateral: Secondary | ICD-10-CM | POA: Diagnosis not present

## 2019-06-09 DIAGNOSIS — H353231 Exudative age-related macular degeneration, bilateral, with active choroidal neovascularization: Secondary | ICD-10-CM | POA: Diagnosis not present

## 2019-06-09 DIAGNOSIS — H43813 Vitreous degeneration, bilateral: Secondary | ICD-10-CM | POA: Diagnosis not present

## 2019-06-09 DIAGNOSIS — I10 Essential (primary) hypertension: Secondary | ICD-10-CM

## 2019-07-07 DIAGNOSIS — Z4689 Encounter for fitting and adjustment of other specified devices: Secondary | ICD-10-CM | POA: Diagnosis not present

## 2019-07-07 DIAGNOSIS — N8111 Cystocele, midline: Secondary | ICD-10-CM | POA: Diagnosis not present

## 2019-07-07 DIAGNOSIS — N814 Uterovaginal prolapse, unspecified: Secondary | ICD-10-CM | POA: Diagnosis not present

## 2019-07-22 ENCOUNTER — Encounter (INDEPENDENT_AMBULATORY_CARE_PROVIDER_SITE_OTHER): Payer: Medicare Other | Admitting: Ophthalmology

## 2019-07-22 ENCOUNTER — Other Ambulatory Visit: Payer: Self-pay

## 2019-07-22 DIAGNOSIS — H43813 Vitreous degeneration, bilateral: Secondary | ICD-10-CM

## 2019-07-22 DIAGNOSIS — I10 Essential (primary) hypertension: Secondary | ICD-10-CM | POA: Diagnosis not present

## 2019-07-22 DIAGNOSIS — H2513 Age-related nuclear cataract, bilateral: Secondary | ICD-10-CM

## 2019-07-22 DIAGNOSIS — H35033 Hypertensive retinopathy, bilateral: Secondary | ICD-10-CM

## 2019-07-22 DIAGNOSIS — H353231 Exudative age-related macular degeneration, bilateral, with active choroidal neovascularization: Secondary | ICD-10-CM

## 2019-08-12 DIAGNOSIS — M13849 Other specified arthritis, unspecified hand: Secondary | ICD-10-CM | POA: Diagnosis not present

## 2019-08-12 DIAGNOSIS — G5601 Carpal tunnel syndrome, right upper limb: Secondary | ICD-10-CM | POA: Diagnosis not present

## 2019-08-12 DIAGNOSIS — F17210 Nicotine dependence, cigarettes, uncomplicated: Secondary | ICD-10-CM | POA: Diagnosis not present

## 2019-08-12 DIAGNOSIS — M79641 Pain in right hand: Secondary | ICD-10-CM | POA: Diagnosis not present

## 2019-08-12 DIAGNOSIS — M13841 Other specified arthritis, right hand: Secondary | ICD-10-CM | POA: Diagnosis not present

## 2019-08-26 ENCOUNTER — Other Ambulatory Visit: Payer: Self-pay

## 2019-08-26 ENCOUNTER — Encounter (INDEPENDENT_AMBULATORY_CARE_PROVIDER_SITE_OTHER): Payer: Medicare Other | Admitting: Ophthalmology

## 2019-08-26 DIAGNOSIS — H35033 Hypertensive retinopathy, bilateral: Secondary | ICD-10-CM

## 2019-08-26 DIAGNOSIS — H43813 Vitreous degeneration, bilateral: Secondary | ICD-10-CM | POA: Diagnosis not present

## 2019-08-26 DIAGNOSIS — I10 Essential (primary) hypertension: Secondary | ICD-10-CM

## 2019-08-26 DIAGNOSIS — H353231 Exudative age-related macular degeneration, bilateral, with active choroidal neovascularization: Secondary | ICD-10-CM

## 2019-09-30 DIAGNOSIS — N814 Uterovaginal prolapse, unspecified: Secondary | ICD-10-CM | POA: Diagnosis not present

## 2019-09-30 DIAGNOSIS — N8111 Cystocele, midline: Secondary | ICD-10-CM | POA: Diagnosis not present

## 2019-10-07 ENCOUNTER — Encounter (INDEPENDENT_AMBULATORY_CARE_PROVIDER_SITE_OTHER): Payer: Medicare Other | Admitting: Ophthalmology

## 2019-10-07 ENCOUNTER — Other Ambulatory Visit: Payer: Self-pay

## 2019-10-07 DIAGNOSIS — H353231 Exudative age-related macular degeneration, bilateral, with active choroidal neovascularization: Secondary | ICD-10-CM | POA: Diagnosis not present

## 2019-10-07 DIAGNOSIS — I10 Essential (primary) hypertension: Secondary | ICD-10-CM | POA: Diagnosis not present

## 2019-10-07 DIAGNOSIS — H43813 Vitreous degeneration, bilateral: Secondary | ICD-10-CM | POA: Diagnosis not present

## 2019-10-07 DIAGNOSIS — H35033 Hypertensive retinopathy, bilateral: Secondary | ICD-10-CM

## 2019-11-12 ENCOUNTER — Telehealth: Payer: Self-pay

## 2019-11-12 NOTE — Telephone Encounter (Signed)
Telephone call to patient to schedule palliative care visit with patient. Patient/family in agreement with home visit on 11/20/19 @11am 

## 2019-11-13 DIAGNOSIS — M810 Age-related osteoporosis without current pathological fracture: Secondary | ICD-10-CM | POA: Diagnosis not present

## 2019-11-13 DIAGNOSIS — F17210 Nicotine dependence, cigarettes, uncomplicated: Secondary | ICD-10-CM | POA: Diagnosis not present

## 2019-11-13 DIAGNOSIS — Z8603 Personal history of neoplasm of uncertain behavior: Secondary | ICD-10-CM | POA: Diagnosis not present

## 2019-11-13 DIAGNOSIS — R636 Underweight: Secondary | ICD-10-CM | POA: Diagnosis not present

## 2019-11-13 DIAGNOSIS — N183 Chronic kidney disease, stage 3 unspecified: Secondary | ICD-10-CM | POA: Diagnosis not present

## 2019-11-13 DIAGNOSIS — Z8781 Personal history of (healed) traumatic fracture: Secondary | ICD-10-CM | POA: Diagnosis not present

## 2019-11-13 DIAGNOSIS — I672 Cerebral atherosclerosis: Secondary | ICD-10-CM | POA: Diagnosis not present

## 2019-11-13 DIAGNOSIS — M1710 Unilateral primary osteoarthritis, unspecified knee: Secondary | ICD-10-CM | POA: Diagnosis not present

## 2019-11-13 DIAGNOSIS — Z9181 History of falling: Secondary | ICD-10-CM | POA: Diagnosis not present

## 2019-11-13 DIAGNOSIS — G8929 Other chronic pain: Secondary | ICD-10-CM | POA: Diagnosis not present

## 2019-11-13 DIAGNOSIS — I129 Hypertensive chronic kidney disease with stage 1 through stage 4 chronic kidney disease, or unspecified chronic kidney disease: Secondary | ICD-10-CM | POA: Diagnosis not present

## 2019-11-13 DIAGNOSIS — K5900 Constipation, unspecified: Secondary | ICD-10-CM | POA: Diagnosis not present

## 2019-11-13 DIAGNOSIS — Z681 Body mass index (BMI) 19 or less, adult: Secondary | ICD-10-CM | POA: Diagnosis not present

## 2019-11-13 DIAGNOSIS — H3530 Unspecified macular degeneration: Secondary | ICD-10-CM | POA: Diagnosis not present

## 2019-11-20 ENCOUNTER — Other Ambulatory Visit: Payer: Medicare Other | Admitting: *Deleted

## 2019-11-20 ENCOUNTER — Other Ambulatory Visit: Payer: Self-pay

## 2019-11-20 VITALS — BP 120/80 | HR 88 | Temp 98.3°F | Resp 17

## 2019-11-20 DIAGNOSIS — Z515 Encounter for palliative care: Secondary | ICD-10-CM

## 2019-11-20 DIAGNOSIS — I129 Hypertensive chronic kidney disease with stage 1 through stage 4 chronic kidney disease, or unspecified chronic kidney disease: Secondary | ICD-10-CM | POA: Diagnosis not present

## 2019-11-20 DIAGNOSIS — K5900 Constipation, unspecified: Secondary | ICD-10-CM | POA: Diagnosis not present

## 2019-11-20 DIAGNOSIS — G8929 Other chronic pain: Secondary | ICD-10-CM | POA: Diagnosis not present

## 2019-11-20 DIAGNOSIS — N183 Chronic kidney disease, stage 3 unspecified: Secondary | ICD-10-CM | POA: Diagnosis not present

## 2019-11-20 DIAGNOSIS — M1710 Unilateral primary osteoarthritis, unspecified knee: Secondary | ICD-10-CM | POA: Diagnosis not present

## 2019-11-20 DIAGNOSIS — R636 Underweight: Secondary | ICD-10-CM | POA: Diagnosis not present

## 2019-11-23 NOTE — Progress Notes (Signed)
COMMUNITY PALLIATIVE CARE RN NOTE  PATIENT NAME: Karen Calhoun DOB: 1922-01-02 MRN: [id number]  PRIMARY CARE PROVIDER: Lajean Manes, MD  RESPONSIBLE PARTY: Lavonna Monarch (son) Acct ID - Guarantor Home Phone Work Phone Relationship Acct Type  [id number] JELESA, [phone]  Self P/F     [address], [place], [place] [address]   Covid-19 Pre-screening Negative  PLAN OF CARE and INTERVENTION:  1. ADVANCE CARE PLANNING/GOALS OF CARE: Goal is for patient to remain in her home as long as possible.  2. PATIENT/CAREGIVER EDUCATION: Explained Palliative care services, pain management, symptom management, safe mobility/transfers, fall prevention 3. DISEASE STATUS: Met with patient and her son, Tressie Ellis, in her home. Upon my arrival, patient is working with in-home physical therapist through [facility]. She also has a Therapist, sports that will be visiting with her weekly. She had a fall about 2 1/2 weeks ago where she fell backwards from a standing position in her kitchen. She c/o pain in her back and pelvic region. She was completely immobile for a few days. She refused going to the hospital or for x-rays. She continues to experience pain in her lower back and sides, but says her pain must be better because she is now able to stand independently and ambulate with her rollator walker, which is something she was unable to do after her fall. She does take Tylenol but feels it gives her very little relief. She does not want to take any medications stronger than this as she says she is sensitive to opioids and they make her feel "loopy" and "unsteady." She was evaluated by hospice last week and met eligibility requirements, however declined services. At that time, her oxygen level was 84% but patient refused oxygen stating that she would rather continue to smoke vs having oxygen in her home. She does not feel that she is ready for hospice services, but is agreeable to Palliative care. Today with therapy, her oxygen  started out at 88% and improved to 90% after some deep breathing exercises. During my visit, it was 92%. She does have a congested cough. She tires very easily and has some mild dyspnea with exertion. She is not currently taking any medications. She says she may start back taking a multivitamin. She has a poor appetite. Recommended protein shakes for added nutrition. She has only drank a glass of orange juice so far today. She could benefit from some assistance with her showers and personal care but refuses help. She had a hired caregiver recently for 3 days while her family travelled out of town. She did enjoy this aid and says that she will consider having her come back in the future. She had been having issues for several days with constipation. She has been taking Miralax every other day and is now having bowel movements several times per day for the past 2 days. Her bottom is red. Recommended Zinc Oxide, which son will pick up today. Also recommended a seat cushion however she refuses this. She is intermittently incontinent of both bowel and bladder and wears Depends. She has a pessary that is cleaned every 6-10 weeks. She has macular degeneration and has been receiving eye injections for the past 8 yrs with minimal success. She listens to books on tape, which she enjoys since she is no longer able to read. She can't see the television but does listen. She is agreeable to future Palliative care visits. Will continue to monitor.   HISTORY OF PRESENT ILLNESS: This is a 86  yo female with a past medical history of macular degeneration, hypertension, hyperlipidemia, stress urinary incontinence and anxiety. Palliative care team asked to follow patient for additional support. Will visit patient monthly and PRN.  CODE STATUS: Full code ADVANCED DIRECTIVES: Y MOST FORM: no PPS: 50%   PHYSICAL EXAM:   VITALS: Today's Vitals   11/20/19 1149  BP: 120/80  Pulse: 88  Resp: 17  Temp: 98.3 F (36.8 C)   TempSrc: Temporal  SpO2: 90%  PainSc: 4   PainLoc: Back    LUNGS: coarse sounds heard CARDIAC: Cor RRR EXTREMITIES: No edema SKIN: Stage 1 to her coccyx and sacrum  NEURO: Alert and oriented x 3, forgetful, poor vision, generalized weakness, ambulatory w/rollator walker   (Duration of visit and documentation 90 minutes)   Daryl Eastern, RN BSN

## 2019-11-25 ENCOUNTER — Encounter (INDEPENDENT_AMBULATORY_CARE_PROVIDER_SITE_OTHER): Payer: Medicare Other | Admitting: Ophthalmology

## 2019-11-27 DIAGNOSIS — K5900 Constipation, unspecified: Secondary | ICD-10-CM | POA: Diagnosis not present

## 2019-11-27 DIAGNOSIS — N183 Chronic kidney disease, stage 3 unspecified: Secondary | ICD-10-CM | POA: Diagnosis not present

## 2019-11-27 DIAGNOSIS — M1710 Unilateral primary osteoarthritis, unspecified knee: Secondary | ICD-10-CM | POA: Diagnosis not present

## 2019-11-27 DIAGNOSIS — I129 Hypertensive chronic kidney disease with stage 1 through stage 4 chronic kidney disease, or unspecified chronic kidney disease: Secondary | ICD-10-CM | POA: Diagnosis not present

## 2019-11-27 DIAGNOSIS — G8929 Other chronic pain: Secondary | ICD-10-CM | POA: Diagnosis not present

## 2019-11-27 DIAGNOSIS — R636 Underweight: Secondary | ICD-10-CM | POA: Diagnosis not present

## 2019-12-04 DIAGNOSIS — G8929 Other chronic pain: Secondary | ICD-10-CM | POA: Diagnosis not present

## 2019-12-04 DIAGNOSIS — N183 Chronic kidney disease, stage 3 unspecified: Secondary | ICD-10-CM | POA: Diagnosis not present

## 2019-12-04 DIAGNOSIS — M1710 Unilateral primary osteoarthritis, unspecified knee: Secondary | ICD-10-CM | POA: Diagnosis not present

## 2019-12-04 DIAGNOSIS — K5900 Constipation, unspecified: Secondary | ICD-10-CM | POA: Diagnosis not present

## 2019-12-04 DIAGNOSIS — R636 Underweight: Secondary | ICD-10-CM | POA: Diagnosis not present

## 2019-12-04 DIAGNOSIS — I129 Hypertensive chronic kidney disease with stage 1 through stage 4 chronic kidney disease, or unspecified chronic kidney disease: Secondary | ICD-10-CM | POA: Diagnosis not present

## 2019-12-11 DIAGNOSIS — M1710 Unilateral primary osteoarthritis, unspecified knee: Secondary | ICD-10-CM | POA: Diagnosis not present

## 2019-12-11 DIAGNOSIS — I129 Hypertensive chronic kidney disease with stage 1 through stage 4 chronic kidney disease, or unspecified chronic kidney disease: Secondary | ICD-10-CM | POA: Diagnosis not present

## 2019-12-11 DIAGNOSIS — G8929 Other chronic pain: Secondary | ICD-10-CM | POA: Diagnosis not present

## 2019-12-11 DIAGNOSIS — K5900 Constipation, unspecified: Secondary | ICD-10-CM | POA: Diagnosis not present

## 2019-12-11 DIAGNOSIS — N183 Chronic kidney disease, stage 3 unspecified: Secondary | ICD-10-CM | POA: Diagnosis not present

## 2019-12-11 DIAGNOSIS — R636 Underweight: Secondary | ICD-10-CM | POA: Diagnosis not present

## 2019-12-13 DIAGNOSIS — R636 Underweight: Secondary | ICD-10-CM | POA: Diagnosis not present

## 2019-12-13 DIAGNOSIS — Z9181 History of falling: Secondary | ICD-10-CM | POA: Diagnosis not present

## 2019-12-13 DIAGNOSIS — M1710 Unilateral primary osteoarthritis, unspecified knee: Secondary | ICD-10-CM | POA: Diagnosis not present

## 2019-12-13 DIAGNOSIS — Z8603 Personal history of neoplasm of uncertain behavior: Secondary | ICD-10-CM | POA: Diagnosis not present

## 2019-12-13 DIAGNOSIS — M810 Age-related osteoporosis without current pathological fracture: Secondary | ICD-10-CM | POA: Diagnosis not present

## 2019-12-13 DIAGNOSIS — Z8781 Personal history of (healed) traumatic fracture: Secondary | ICD-10-CM | POA: Diagnosis not present

## 2019-12-13 DIAGNOSIS — H3530 Unspecified macular degeneration: Secondary | ICD-10-CM | POA: Diagnosis not present

## 2019-12-13 DIAGNOSIS — G8929 Other chronic pain: Secondary | ICD-10-CM | POA: Diagnosis not present

## 2019-12-13 DIAGNOSIS — F17210 Nicotine dependence, cigarettes, uncomplicated: Secondary | ICD-10-CM | POA: Diagnosis not present

## 2019-12-13 DIAGNOSIS — I129 Hypertensive chronic kidney disease with stage 1 through stage 4 chronic kidney disease, or unspecified chronic kidney disease: Secondary | ICD-10-CM | POA: Diagnosis not present

## 2019-12-13 DIAGNOSIS — N183 Chronic kidney disease, stage 3 unspecified: Secondary | ICD-10-CM | POA: Diagnosis not present

## 2019-12-13 DIAGNOSIS — I672 Cerebral atherosclerosis: Secondary | ICD-10-CM | POA: Diagnosis not present

## 2019-12-13 DIAGNOSIS — K5900 Constipation, unspecified: Secondary | ICD-10-CM | POA: Diagnosis not present

## 2019-12-13 DIAGNOSIS — Z681 Body mass index (BMI) 19 or less, adult: Secondary | ICD-10-CM | POA: Diagnosis not present

## 2019-12-14 DIAGNOSIS — K5900 Constipation, unspecified: Secondary | ICD-10-CM | POA: Diagnosis not present

## 2019-12-14 DIAGNOSIS — N183 Chronic kidney disease, stage 3 unspecified: Secondary | ICD-10-CM | POA: Diagnosis not present

## 2019-12-14 DIAGNOSIS — R636 Underweight: Secondary | ICD-10-CM | POA: Diagnosis not present

## 2019-12-14 DIAGNOSIS — I129 Hypertensive chronic kidney disease with stage 1 through stage 4 chronic kidney disease, or unspecified chronic kidney disease: Secondary | ICD-10-CM | POA: Diagnosis not present

## 2019-12-18 DIAGNOSIS — G8929 Other chronic pain: Secondary | ICD-10-CM | POA: Diagnosis not present

## 2019-12-18 DIAGNOSIS — I129 Hypertensive chronic kidney disease with stage 1 through stage 4 chronic kidney disease, or unspecified chronic kidney disease: Secondary | ICD-10-CM | POA: Diagnosis not present

## 2019-12-18 DIAGNOSIS — N183 Chronic kidney disease, stage 3 unspecified: Secondary | ICD-10-CM | POA: Diagnosis not present

## 2019-12-18 DIAGNOSIS — R636 Underweight: Secondary | ICD-10-CM | POA: Diagnosis not present

## 2019-12-18 DIAGNOSIS — M1710 Unilateral primary osteoarthritis, unspecified knee: Secondary | ICD-10-CM | POA: Diagnosis not present

## 2019-12-18 DIAGNOSIS — K5900 Constipation, unspecified: Secondary | ICD-10-CM | POA: Diagnosis not present

## 2020-01-08 ENCOUNTER — Other Ambulatory Visit: Payer: Self-pay

## 2020-01-08 ENCOUNTER — Other Ambulatory Visit: Payer: Medicare Other

## 2020-01-08 DIAGNOSIS — M1710 Unilateral primary osteoarthritis, unspecified knee: Secondary | ICD-10-CM | POA: Diagnosis not present

## 2020-01-08 DIAGNOSIS — I129 Hypertensive chronic kidney disease with stage 1 through stage 4 chronic kidney disease, or unspecified chronic kidney disease: Secondary | ICD-10-CM | POA: Diagnosis not present

## 2020-01-08 DIAGNOSIS — R636 Underweight: Secondary | ICD-10-CM | POA: Diagnosis not present

## 2020-01-08 DIAGNOSIS — N183 Chronic kidney disease, stage 3 unspecified: Secondary | ICD-10-CM | POA: Diagnosis not present

## 2020-01-08 DIAGNOSIS — G8929 Other chronic pain: Secondary | ICD-10-CM | POA: Diagnosis not present

## 2020-01-08 DIAGNOSIS — K5900 Constipation, unspecified: Secondary | ICD-10-CM | POA: Diagnosis not present

## 2020-01-08 DIAGNOSIS — Z515 Encounter for palliative care: Secondary | ICD-10-CM

## 2020-01-11 NOTE — Progress Notes (Signed)
COMMUNITY PALLIATIVE CARE SW NOTE  PATIENT NAME: Karen Calhoun DOB: 08-Jul-1921 MRN: [id number]  PRIMARY CARE PROVIDER: Lajean Manes, MD  RESPONSIBLE PARTY:  Acct ID - Guarantor Home Phone Work Phone Relationship Acct Type  [id number] Elise [phone]  Self P/F     [address], [place], [place] [address]     PLAN OF CARE and INTERVENTIONS:             1. GOALS OF CARE/ ADVANCE CARE PLANNING:  Goal is for patient to remain in her home. She is a FULL CODE. 2. SOCIAL/EMOTIONAL/SPIRITUAL ASSESSMENT/ INTERVENTIONS:  SW completed a visit with patient at her home. Her son-Stuart was present with her. Patient was sitting on her the couch, awake and alert. She denied pain. Patient reported that she continues to have physical therapy 2x weeks. She stopped taking her blood pressure on her own, but report that her blood pressure has been normal. Her weight is currently under 100 lbs, but she was not sure of her weight. She report that her intake is good, but she does have difficulty griping her utensils and keeping it in her hand due to shaking. She report increased weakness. She and her son report no falls, not issues with patient sleeping or cognitive changes. Patient's son expressed getting additional assistance for patient. He shared that he was told about a county program that would provide free personal care and housekeeping services for his mother. SW advised him that she had not heard of this service, but would look into and get back with him. SW provided supportive presence, active listening, assessment of patient's needs and comfort and PCG coping. Patient and PCG open to additional palliative care visits and support. 3. PATIENT/CAREGIVER EDUCATION/ COPING:  Patient is alert and oriented x3. She was verbal about her intention to remain independent and voice her needs/ She seems to be coping well. Her son/PCG appears to be coping well.  4. PERSONAL EMERGENCY PLAN:  911 can be activated for  emergenceis.  5. COMMUNITY RESOURCES COORDINATION/ HEALTH CARE NAVIGATION:  Patient receives physical therapy.  6. FINANCIAL/LEGAL CONCERNS/INTERVENTIONS:  None     SOCIAL HX:  Social History   Tobacco Use  . Smoking status: Current Every Day Smoker    Packs/day: 1.00    Years: 72.00    Pack years: 72.00    Types: Cigarettes  . Smokeless tobacco: Never Used  Substance Use Topics  . Alcohol use: Yes    Comment: rare    CODE STATUS: Patient is a FULL CODE ADVANCED DIRECTIVES: No MOST FORM COMPLETE: No HOSPICE EDUCATION PROVIDED: No  PPS: Patient is alert and oriented x3. She is independent for personal care needs. Her appetite is fair. Patient is spending time listening to books on tape due to being legally blind.   Duration of visit and documentation: 60 minutes.      [address] [place], [place]

## 2020-02-03 DIAGNOSIS — N814 Uterovaginal prolapse, unspecified: Secondary | ICD-10-CM | POA: Diagnosis not present

## 2020-02-03 DIAGNOSIS — N8111 Cystocele, midline: Secondary | ICD-10-CM | POA: Diagnosis not present

## 2020-02-03 DIAGNOSIS — Z4689 Encounter for fitting and adjustment of other specified devices: Secondary | ICD-10-CM | POA: Diagnosis not present

## 2020-02-26 ENCOUNTER — Other Ambulatory Visit: Payer: Medicare Other | Admitting: *Deleted

## 2020-02-26 ENCOUNTER — Other Ambulatory Visit: Payer: Self-pay

## 2020-02-26 VITALS — BP 110/74 | HR 74 | Temp 98.2°F | Resp 18

## 2020-02-26 DIAGNOSIS — Z515 Encounter for palliative care: Secondary | ICD-10-CM
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
## 2020-03-23 ENCOUNTER — Other Ambulatory Visit: Payer: Self-pay

## 2020-03-23 ENCOUNTER — Other Ambulatory Visit: Payer: Medicare Other

## 2020-03-23 DIAGNOSIS — Z515 Encounter for palliative care: Secondary | ICD-10-CM

## 2020-03-25 NOTE — Progress Notes (Signed)
COMMUNITY PALLIATIVE CARE SW NOTE  PATIENT NAME: Karen [place] DOB: May 20, 1921 MRN: [id number]  PRIMARY CARE PROVIDER: Lajean Manes, MD  RESPONSIBLE PARTY:  Acct ID - Guarantor Home Phone Work Phone Relationship Acct Type  [id number] FLONNIE, [phone]  Self P/F     [address], [place], [place] [address]   Due to the COVID-19 crisis, this virtual check-in visit was done via telephone from my office and it was initiated and consent by this patient and or family.  PLAN OF CARE and INTERVENTIONS:             1. GOALS OF CARE/ ADVANCE CARE PLANNING:  Goal is for patient to remain in her home. She is a FULL CODE. 1.  2. SOCIAL/EMOTIONAL/SPIRITUAL ASSESSMENT/ INTERVENTIONS:  SW completed a telephonic visit with patient's son/PCG. He provided a status update on patient. He that patient's appetite is poor. She is no longer getting physical therapy, so is not getting much exercise. She is legally blind and feels that patient is depressed. He stated that patient is on the waiting list for personal care services through DSS, but she has over 90 others ahead of her. SW inquired if paid support would be considered for care or counseling. PCG advised that patient refuses to pay for care or any service and she would not allow he or his brother to pay for it either. SW discussed her religious affiliation as she could try to connect patient to her church. He advised that patient has no religious/spiritual affiliation. SW advised that she was also available to provide counseling and support to patient if she was open to it. No other concerns were noted. SW provided supportive presence, active listening, assessment of patient's needs and comfort and PCG coping. Patient and PCG open to additional palliative care visits and support. 2.  3. PATIENT/CAREGIVER EDUCATION/ COPING:   Patient is alert and oriented x3. She was verbal about her intention to remain independent and voice her needs/ She seems to be  coping well. Her son/PCG appears to be coping well 3.  4. PERSONAL EMERGENCY PLAN:  911 can be activated for emergencies. 5. COMMUNITY RESOURCES COORDINATION/ HEALTH CARE NAVIGATION:  None 6. FINANCIAL/LEGAL CONCERNS/INTERVENTIONS:  None     SOCIAL HX:  Social History   Tobacco Use  . Smoking status: Current Every Day Smoker    Packs/day: 1.00    Years: 72.00    Pack years: 72.00    Types: Cigarettes  . Smokeless tobacco: Never Used  Substance Use Topics  . Alcohol use: Yes    Comment: rare    CODE STATUS: Full Code ADVANCED DIRECTIVES: No MOST FORM COMPLETE:  No HOSPICE EDUCATION PROVIDED: No  PPS: Patient is alert and oriented x3. She is independent for personal care needs. Her appetite is poor. Patient is spending time listening to books on tape due to being legally blind, but is not   Duration of telephonic visit and documentation: 30 minutes.      [address] [place], [place]

## 2020-04-28 ENCOUNTER — Other Ambulatory Visit: Payer: Self-pay

## 2020-04-28 ENCOUNTER — Other Ambulatory Visit: Payer: Medicare Other | Admitting: *Deleted

## 2020-04-28 DIAGNOSIS — Z515 Encounter for palliative care: Secondary | ICD-10-CM

## 2020-05-02 ENCOUNTER — Telehealth: Payer: Self-pay | Admitting: *Deleted

## 2020-05-02 NOTE — Telephone Encounter (Signed)
Received a communication from patient's son, Tressie Ellis, stating that patient fell again yesterday and has a possible rib and thumb fractures. She is refusing to go to her doctor or ER, or even allow him to exam her. He is requesting a palliative care RN visit prior to our previously scheduled visit this Friday, 05/06/20. Visit is now scheduled for tomorrow 4/5@1p .

## 2020-05-03 ENCOUNTER — Other Ambulatory Visit: Payer: Medicare Other | Admitting: *Deleted

## 2020-05-03 ENCOUNTER — Other Ambulatory Visit: Payer: Self-pay

## 2020-05-03 VITALS — HR 106 | Temp 97.9°F | Resp 20

## 2020-05-03 DIAGNOSIS — Z515 Encounter for palliative care: Secondary | ICD-10-CM

## 2020-05-06 ENCOUNTER — Other Ambulatory Visit: Payer: Self-pay

## 2020-05-06 ENCOUNTER — Emergency Department (HOSPITAL_BASED_OUTPATIENT_CLINIC_OR_DEPARTMENT_OTHER)
Admission: EM | Admit: 2020-05-06 | Discharge: 2020-05-06 | Disposition: A | Discharge status: Home / Self Care | Payer: Medicare Other | Attending: Emergency Medicine | Admitting: Emergency Medicine

## 2020-05-06 ENCOUNTER — Emergency Department (HOSPITAL_BASED_OUTPATIENT_CLINIC_OR_DEPARTMENT_OTHER): Payer: Medicare Other

## 2020-05-06 ENCOUNTER — Encounter (HOSPITAL_BASED_OUTPATIENT_CLINIC_OR_DEPARTMENT_OTHER): Payer: Self-pay | Admitting: *Deleted

## 2020-05-06 DIAGNOSIS — Z79899 Other long term (current) drug therapy: Secondary | ICD-10-CM | POA: Diagnosis not present

## 2020-05-06 DIAGNOSIS — S2020XA Contusion of thorax, unspecified, initial encounter: Secondary | ICD-10-CM | POA: Diagnosis not present

## 2020-05-06 DIAGNOSIS — F17210 Nicotine dependence, cigarettes, uncomplicated: Secondary | ICD-10-CM | POA: Insufficient documentation

## 2020-05-06 DIAGNOSIS — M79602 Pain in left arm: Secondary | ICD-10-CM | POA: Diagnosis not present

## 2020-05-06 DIAGNOSIS — Y9301 Activity, walking, marching and hiking: Secondary | ICD-10-CM | POA: Diagnosis not present

## 2020-05-06 DIAGNOSIS — S20219A Contusion of unspecified front wall of thorax, initial encounter: Secondary | ICD-10-CM | POA: Diagnosis not present

## 2020-05-06 DIAGNOSIS — I517 Cardiomegaly: Secondary | ICD-10-CM | POA: Diagnosis not present

## 2020-05-06 DIAGNOSIS — I10 Essential (primary) hypertension: Secondary | ICD-10-CM | POA: Diagnosis not present

## 2020-05-06 DIAGNOSIS — Z7982 Long term (current) use of aspirin: Secondary | ICD-10-CM | POA: Insufficient documentation

## 2020-05-06 DIAGNOSIS — S62524A Nondisplaced fracture of distal phalanx of right thumb, initial encounter for closed fracture: Secondary | ICD-10-CM | POA: Insufficient documentation

## 2020-05-06 DIAGNOSIS — S40022A Contusion of left upper arm, initial encounter: Secondary | ICD-10-CM | POA: Diagnosis not present

## 2020-05-06 DIAGNOSIS — S6991XA Unspecified injury of right wrist, hand and finger(s), initial encounter: Secondary | ICD-10-CM | POA: Diagnosis present

## 2020-05-06 DIAGNOSIS — W01190A Fall on same level from slipping, tripping and stumbling with subsequent striking against furniture, initial encounter: Secondary | ICD-10-CM | POA: Insufficient documentation

## 2020-05-06 DIAGNOSIS — R0781 Pleurodynia: Secondary | ICD-10-CM | POA: Diagnosis not present

## 2020-05-06 DIAGNOSIS — M19041 Primary osteoarthritis, right hand: Secondary | ICD-10-CM | POA: Diagnosis not present

## 2020-05-06 MED ORDER — ONDANSETRON 4 MG PO TBDP: 4.0000 mg | ORAL_TABLET | Freq: Three times a day (TID) | ORAL | 0 refills | Status: AC | PRN

## 2020-05-06 MED ORDER — ONDANSETRON 4 MG PO TBDP
4.0000 mg | ORAL_TABLET | Freq: Once | ORAL | Status: AC
  Administered 2020-05-06: 4 mg via ORAL
  Filled 2020-05-06: qty 1

## 2020-05-06 MED ORDER — HYDROCODONE-ACETAMINOPHEN 5-325 MG PO TABS
1.0000 | ORAL_TABLET | Freq: Once | ORAL | Status: AC
  Administered 2020-05-06: 1 via ORAL
  Filled 2020-05-06: qty 1

## 2020-05-06 MED ORDER — HYDROCODONE-ACETAMINOPHEN 5-325 MG PO TABS: 1.0000 | ORAL_TABLET | Freq: Four times a day (QID) | ORAL | 0 refills | Status: AC | PRN

## 2020-05-06 NOTE — ED Triage Notes (Addendum)
C/o fall x 5 days ago , fall from standing landing on carpet hitting wooden table c/o left upper arm pain and right thumb pain, son reports OS sat 88-90% is normal for  Pt

## 2020-05-06 NOTE — ED Notes (Signed)
IS instructed with patient. Tolerated well. 750 x 5

## 2020-05-06 NOTE — ED Provider Notes (Signed)
[facility] DEPART[facility] Provider Note   CSN: [id number] Arrival date & time: 05/06/20  1349     History Chief Complaint  Patient presents with  . Fall    Karen Calhoun is a 86 y.o. female past medical history of anxiety, hyperlipidemia, hypertension who presents for evaluation after a fall that occurred 5 days ago.  Patient reports that she was walking with her walker and states that she tripped and fell, landing on her left arm.  She states she did not hit her head or lose consciousness.  She is not on blood thinners.  She reports since then, she has had pain to her left upper arm as well as her right thumb.  Additionally, she started experiencing chest pain.  States it is mostly across the anterior part of her chest.  It is worse when she bends, moves, takes a deep breath in.  She states occasionally, the pain will get so sharp and severe that it takes her breath away.  She has not had any abdominal pain, nausea/vomiting.  She has been able to ambulate since this happened.  She denies any pain to her hips or her legs. She denies any neck or back pain.   The history is provided by the patient.       Past Medical History:  Diagnosis Date  . Anxiety   . Complication of anesthesia    VERY HIGH ANXIOUS  . History of chronic sinusitis   . Hyperlipidemia   . Hypertension   . 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 Macular degeneration   . SUI (stress urinary incontinence, female)     Patient Active Problem List   Diagnosis Date Noted  . Inadequate caloric intake 11/29/2016    Past Surgical History:  Procedure Laterality Date  . APPENDECTOMY  age 44's  . CLAVICLE SURGERY    . CYSTOSCOPY MACROPLASTIQUE IMPLANT N/A 01/07/2015   Procedure: CYSTOSCOPY MACROPLASTIQUE IMPLANT;  Surgeon: Bjorn Loser, MD;  Location: [facility];  Service: Urology;  Laterality: N/A;  . ORIF ANKLE FX  2006   retained hardware  . TONSILLECTOMY  age  16's     OB History   No obstetric history on file.     No family history on file.  Social History   Tobacco Use  . Smoking status: Current Every Day Smoker    Packs/day: 1.00    Years: 72.00    Pack years: 72.00    Types: Cigarettes  . Smokeless tobacco: Never Used  Substance Use Topics  . Alcohol use: Yes    Comment: rare  . Drug use: No    Home Medications Prior to Admission medications   Medication Sig Start Date End Date Taking? Authorizing Provider  HYDROcodone-acetaminophen (NORCO/VICODIN) 5-325 MG tablet Take 1 tablet by mouth every 6 (six) hours as needed. 05/06/20  Yes Providence Lanius A, PA-C  ondansetron (ZOFRAN ODT) 4 MG disintegrating tablet Take 1 tablet (4 mg total) by mouth every 8 (eight) hours as needed for nausea or vomiting. 05/06/20  Yes Volanda Napoleon, PA-C  acetaminophen (TYLENOL) 325 MG tablet Take 650 mg by mouth every 6 (six) hours as needed. Headache or pain    [provider]  amLODipine (NORVASC) 5 MG tablet Take 5 mg by mouth daily.    [provider]  aspirin EC 81 MG tablet Take 81 mg by mouth daily.    [provider]  atenolol (TENORMIN)  50 MG tablet Take 50 mg by mouth daily.    [provider]  atorvastatin (LIPITOR) 10 MG tablet Take 10 mg by mouth daily.    [provider]  diphenhydramine-acetaminophen (TYLENOL PM EXTRA STRENGTH) 25-500 MG TABS Take 1 tablet by mouth at bedtime as needed. sleep    [provider]  hydrALAZINE (APRESOLINE) 25 MG tablet Take 25 mg by mouth 2 (two) times daily.    [provider]  hydrochlorothiazide (HYDRODIURIL) 25 MG tablet Take 25 mg by mouth daily.    [provider]  Multiple Vitamin (MULITIVITAMIN WITH MINERALS) TABS Take 1 tablet by mouth daily.    [provider]  potassium chloride SA (K-DUR,KLOR-CON) 20 MEQ tablet Take 20 mEq by mouth daily.    [provider]  Vitamin D, Cholecalciferol, 1000 units CAPS Take  by mouth.    [provider]    Allergies    Shellfish allergy, Codeine, Oxycodone hcl, Tramadol, and Sulfa antibiotics  Review of Systems   Review of Systems  Constitutional: Negative for fever.  Respiratory: Negative for shortness of breath.   Cardiovascular: Positive for chest pain.  Gastrointestinal: Negative for abdominal pain, nausea and vomiting.  Musculoskeletal:       LUE pain Right thumb pain  Neurological: Negative for weakness, numbness and headaches.  All other systems reviewed and are negative.   Physical Exam Updated Vital Signs BP (!) 166/82   Pulse 73   Temp 98.1 F (36.7 C)   Resp 18   Ht 5\' 1"  (1.549 m)   Wt 36.7 kg   SpO2 90%   BMI 15.30 kg/m   Physical Exam Vitals and nursing note reviewed.  Constitutional:      Appearance: Normal appearance. She is well-developed.  HENT:     Head: Normocephalic and atraumatic.     Comments: No tenderness to palpation of skull. No deformities or crepitus noted. No open wounds, abrasions or lacerations.  Eyes:     General: Lids are normal.     Conjunctiva/sclera: Conjunctivae normal.     Pupils: Pupils are equal, round, and reactive to light.  Neck:     Comments: Full flexion/extension and lateral movement of neck fully intact. No bony midline tenderness. No deformities or crepitus.  Cardiovascular:     Rate and Rhythm: Normal rate and regular rhythm.     Pulses: Normal pulses.          Radial pulses are 2+ on the right side and 2+ on the left side.       Dorsalis pedis pulses are 2+ on the right side and 2+ on the left side.     Heart sounds: Normal heart sounds. No murmur heard. No friction rub. No gallop.   Pulmonary:     Effort: Pulmonary effort is normal.     Breath sounds: Normal breath sounds.     Comments: Lungs clear to auscultation bilaterally.  Symmetric chest rise.  No wheezing, rales, rhonchi. Chest:       Comments: Tenderness to palpation noted to the anterior chest wall. No  deformity or crepitus noted.  Abdominal:     Palpations: Abdomen is soft. Abdomen is not rigid.     Tenderness: There is no abdominal tenderness. There is no guarding.     Comments: Abdomen is soft, non-distended, non-tender. No rigidity, No guarding. No peritoneal signs.  Musculoskeletal:        General: Normal range of motion.     Cervical back: Full  passive range of motion without pain.     Comments: Tenderness palpation noted to the left humerus.  There is a skin abrasion noted.  No overlying warmth, erythema.  She has some overlying ecchymosis.  No deformity or crepitus noted.  No bony tenderness of the left shoulder, left elbow, forearm.  Flexion/tension of both the left shoulder and left forearm intact with any difficulty.  No bony tenderness noted to the right shoulder, right elbow, right forearm, right wrist.  She has tenderness noted to the right thumb with overlying soft tissue swelling noted that the IP joint.  Flexion/extension intact but she does report pain with doing so. No midline T or L spine tenderness.  No pelvic instability. No tenderness to palpation to bilateral knees and ankles. No deformities or crepitus noted. FROM of BLE without any difficulty.   Skin:    General: Skin is warm and dry.     Capillary Refill: Capillary refill takes less than 2 seconds.  Neurological:     Mental Status: She is alert and oriented to person, place, and time.     Comments: Follows commands, Moves all extremities  5/5 strength to BUE and BLE  Sensation intact throughout all major nerve distributions  Psychiatric:        Speech: Speech normal.     ED Results / Procedures / Treatments   Labs (all labs ordered are listed, but only abnormal results are displayed) Labs Reviewed - No data to display  EKG EKG Interpretation  Date/Time:  Friday May 06 2020 14:48:17 EDT Ventricular Rate:  75 PR Interval:  153 QRS Duration: 82 QT Interval:  357 QTC Calculation: 399 R Axis:   85 Text  Interpretation: Sinus rhythm Prominent P waves, nondiagnostic Borderline right axis deviation Abnormal T, consider ischemia, lateral leads No significant change since 12/16 Confirmed by Aletta Edouard [phone]) on 05/06/2020 2:52:36 PM   Radiology DG Chest 2 View  Result Date: 05/06/2020 CLINICAL DATA:  Post fall with rib pain. 87 year old post fall from standing landing on carpet. EXAM: CHEST - 2 VIEW COMPARISON:  Rib radiographs 11/26/2011 FINDINGS: Borderline cardiomegaly. Dense aortic atherosclerosis.The cardiomediastinal contours are normal. Subsegmental atelectasis or scarring at the left lung base. Pulmonary vasculature is normal. No consolidation, pleural effusion, or pneumothorax. Bones are subjectively under mineralized. No evidence of rib fracture. There is a severe compression deformity in the lower thoracic spine, age indeterminate. IMPRESSION: 1. No evidence of acute rib fracture or acute pulmonary process. 2. Severe compression deformity in the lower thoracic spine, age indeterminate. Recommend correlation with history of back pain. Electronically Signed   By: Keith Rake M.D.   On: 05/06/2020 15:50   DG Humerus Left  Result Date: 05/06/2020 CLINICAL DATA:  Post fall with left arm pain. EXAM: LEFT HUMERUS - 2+ VIEW COMPARISON:  None. FINDINGS: Cortical margins of the humerus are intact. There is no evidence of fracture or other focal bone lesions. Shoulder and elbow alignment are maintained. Soft tissues are unremarkable. IMPRESSION: No fracture of the left humerus. Electronically Signed   By: Keith Rake M.D.   On: 05/06/2020 15:51   DG Hand Complete Right  Result Date: 05/06/2020 CLINICAL DATA:  Right thumb pain.  Fall. EXAM: RIGHT HAND - COMPLETE 3+ VIEW COMPARISON:  None. FINDINGS: Cortical irregularity of the thumb distal phalanx is suspicious for nondisplaced fracture. No other fracture of the hand. There is advanced multifocal osteoarthritis throughout the digits, as well as  the thumb carpal metacarpal joint. Probable remote injury  to the distal ulna. No dislocation IMPRESSION: 1. Suspected nondisplaced fracture of the thumb distal phalanx. 2. Advanced multifocal osteoarthritis. Electronically Signed   By: Keith Rake M.D.   On: 05/06/2020 15:53    Procedures Procedures   Medications Ordered in ED Medications  HYDROcodone-acetaminophen (NORCO/VICODIN) 5-325 MG per tablet 1 tablet (1 tablet Oral Given 05/06/20 1442)  ondansetron (ZOFRAN-ODT) disintegrating tablet 4 mg (4 mg Oral Given 05/06/20 1442)    ED Course  I have reviewed the triage vital signs and the nursing notes.  Pertinent labs & imaging results that were available during my care of the patient were reviewed by me and considered in my medical decision making (see chart for details).  Clinical Course as of 05/07/20 1535  Fri May 06, 6169  686 87 year old female frail here after mechanical fall few days ago.  Has left upper arm pain right thumb pain and some chest pain worse with movement.  She is getting some x-rays and EKG.  Disposition per results of testing. [MB]    Clinical Course User Index [MB] Hayden Rasmussen, MD   MDM Rules/Calculators/A&P                          87 year old female who presents for evaluation after mechanical fall that occurred 5 days ago.  She reports that she was using her walker and states that she tripped and fell, landing on her arm.  She reports since then, she has had pain across her anterior chest as well as her left upper extremity, right thumb.  She states that the pain is worse when she moves, bends and when she takes a deep breath in.  She states she had no preceding chest pain or dizziness that caused her fall.  Did not hit her head.  She is not on LOC.  On initial arrival, she is afebrile compares uncomfortable but no acute distress.  She is slightly hypertensive.  Likely secondary to pain.  O2 sats are fluctuating between 88-89%.  Son states that this is  patient's normal oxygen baseline.  She does not want to wear oxygen.  On exam, she has tenderness palpation that is reproduced on the anterior chest wall.  She has pain noted to her left humerus as well as her right thumb.  Concern for fracture versus dislocation.  Also concern for rib injury.  Her pain started after mechanical fall and is reproducible on palpation.  Pain is consistent with musculoskeletal injury.  Do not suspect cardiac etiology.  Patient reports that she did not hit her head.  She denies any blood thinner use.  Son states that she is at mental baseline.  Additionally, she has not had any nausea/vomiting, numbness/weakness of her arms or legs and has been 5 days since the incident. Discussed patient with Dr. Melina Copa who is agreeable to plan.   Chest x-ray shows no acute fracture of the ribs.  There is mention of a compression thoracic deformity.  She has no T-spine tenderness on my exam.  X-ray of thumb shows a small nondisplaced fracture.  X-ray of left humerus negative.  I discussed results with patient and son.  We discussed further treatment options in the ED, including further evaluation with a CT of her chest for evaluation of rib fractures that was missed on the chest x-ray.  We discussed also treating this as a potential rib contusion with pain medication, incentive spirometer.  After discussion with both patient and son,  we engaged in shared decision making and opted not to pursue further imaging but to treat as potential rib contusion.  Additionally, I discussed with son regarding not obtaining CT head given lack of head injury, no blood thinners and reassuring exam after 5 days.  He is in agreement.  Patient given short course of pain medication as well as incentive spirometer.  Instructed to follow-up with her primary care doctor. At this time, patient exhibits no emergent life-threatening condition that require further evaluation in ED. Patient had ample opportunity for questions  and discussion. All patient's questions were answered with full understanding. Strict return precautions discussed. Patient expresses understanding and agreement to plan.   Portions of this note were generated with Lobbyist. Dictation errors may occur despite best attempts at proofreading.   Final Clinical Impression(s) / ED Diagnoses Final diagnoses:  Contusion of rib, unspecified laterality, initial encounter  Closed nondisplaced fracture of distal phalanx of right thumb, initial encounter    Rx / DC Orders ED Discharge Orders         Ordered    HYDROcodone-acetaminophen (NORCO/VICODIN) 5-325 MG tablet  Every 6 hours PRN        05/06/20 1634    ondansetron (ZOFRAN ODT) 4 MG disintegrating tablet  Every 8 hours PRN        05/06/20 1634           Volanda Napoleon, PA-C 05/07/20 1539    Hayden Rasmussen, MD 05/08/20 [phone]

## 2020-05-06 NOTE — Discharge Instructions (Signed)
You can take Tylenol or Ibuprofen as directed for pain. You can alternate Tylenol and Ibuprofen every 4 hours. If you take Tylenol at 1pm, then you can take Ibuprofen at 5pm. Then you can take Tylenol again at 9pm.   Take pain medications as directed for break through pain. Do not drive or operate machinery while taking this medication.   Take zofran as needed for nausea.  As we discussed, it is important for you to use the incentive spirometer to prevent pneumonia from occurring.  Keep the splint on thumb.  Follow-up with referred hand doctor.  Follow-up with your primary care doctor.  Return emergency department for any fever, worsening difficulty breathing, worsening chest pain or any other worsening concerning symptoms.

## 2020-05-12 NOTE — Progress Notes (Signed)
COMMUNITY PALLIATIVE CARE RN NOTE  PATIENT NAME: Karen Calhoun DOB: May 20, 1921 MRN: [id number]  PRIMARY CARE PROVIDER: Deland Pretty, MD  RESPONSIBLE PARTY: Lavonna Monarch (son) Acct ID - Guarantor Home Phone Work Phone Relationship Acct Type  [id number] CONCEPTION, [phone]  Self P/F     [address], [place], [place] [address]   Due to the COVID-19 crisis, this virtual check-in visit was done via telephone from my office and it was initiated and consent by this patient and or family.  PLAN OF CARE and INTERVENTION:  1. ADVANCE CARE PLANNING/GOALS OF CARE: Goal is for patient to remain in her home and avoid hospitalizations. She is a Full code. 2. PATIENT/CAREGIVER EDUCATION: Symptom management, safe mobility, s/s of infection 3. DISEASE STATUS: Virtual check-in visit completed via telephone. Patient denies pain at this time. She denies shortness of breath or coughing. She continues to smoke. Son states her oxygen levels have been staying above 90%. She remains very weak and frail. She is ambulatory using her rollator walker. She is able to stand, ambulate, dress, feed and toilet herself independently. She does take sponge bathes at the sink, but son does not feel that she is able to perform this task well. She is afraid to take showers due to her fear of falling. She also has very poor vision. Patient is on the waiting list through [facility] for home health aids. She is number on the list. She will not allow her son to hire private sitters or agency help due to the cost. She does not want her family to pay for anything pertaining to her. She has a very poor appetite. She is intermittently incontinent of bladder and wears Depends. She just celebrated a birthday this week. Son states that she often says that she is "ready to die" and feels she has a poor quality of life due to various factors e.g most of friends have passed, poor vision, difficulty performing ADLs d/t progressive  weakness. Her family visits and checks in on her daily. Will continue to monitor.  HISTORY OF PRESENT ILLNESS: This is a 87 yo female with a past medical history of macular degeneration, hypertension, hyperlipidemia, stress urinary incontinence and anxiety. Palliative care team continues to follow patient and will visit patient monthly and PRN.    CODE STATUS: Full code ADVANCED DIRECTIVES: Y MOST FORM: no PPS: 50%   (Duration of visit and documentation 30 minutes)   Daryl Eastern, RN BSN

## 2020-05-23 NOTE — Progress Notes (Signed)
COMMUNITY PALLIATIVE CARE RN NOTE  PATIENT NAME: Karen Calhoun DOB: 1921-11-02 MRN: [id number]  PRIMARY CARE PROVIDER: Deland Pretty, MD  RESPONSIBLE PARTY: Lavonna Monarch (son) Acct ID - Guarantor Home Phone Work Phone Relationship Acct Type  [id number] ELLARAE, [id number]  Self P/F     [address], [place], [place] [address]   Covid-19 Pre-screening Negative  PLAN OF CARE and INTERVENTION:  1. ADVANCE CARE PLANNING/GOALS OF CARE: Goal is for patient to remain in her home and avoid hospitalizations.  2. PATIENT/CAREGIVER EDUCATION: Symptom management, pain management, safe mobility, fall prevention 3. DISEASE STATUS: Met with patient and her son in the home. Son was unable to stay for entire visit, as he had to get back to work. Patient had a mechanical fall over the weekend. She c/o moderate to severe pain in her left arm, right thumb and severe pain across her chest. She has bruising noted to her left arm and right thumb. She did allow me to assess her chest, but no bruising is noted there. Pain mainly occurs with any movement. Fractures are suspected, however patient is refusing to see her primary physician or go the ED at this time. She has been spending most of her time lying in bed. She is having difficulty sitting up in bed, standing and ambulating with her rollator walker d/t this pain. Her breathing is shallow, as breathing deeply causes her pain also. She was able to get up out of bed and ambulate with her walker into the kitchen while I was beside her. It did take several attempts before she was able to stand. I fixed her a glass of water and made sure she took her extra strength Tylenol. She is very reluctant to take any pain medications. She would benefit from hired caregivers, but continues to refuse this care d/t cost. I recommended at least have them to come for a few hours to assist her with her personal care. She is still on the waiting list for aide services through  [facility]. She is able to give herself sponge baths while sitting at the sink, but it is becoming more difficult. She is afraid to get into the shower due to falls/weakness. Her appetite is poor. She is very thin and frail. She continues to smoke. She has several scabbed areas scattered over her body, but more substantial on her legs. She has one area that does drain serous fluid on occasion on her left leg. She says she doesn't want her son to find out about these areas as she does not want to see a Dermatologist about them. She is a very private person. She has a depressed mood and is frustrated with her continual physical decline. She also has very poor vision and can only see outlines of people. She does continue to listen to books on tapes. She says she will think about going to the ED. Sent son a communication regarding our visit as requested. He is Patent attorney. Will continue to monitor.  HISTORY OF PRESENT ILLNESS:  This is a 87 yo female with a past medical history of macular degeneration, hypertension, hyperlipidemia, stress urinary incontinence and anxiety. Palliative care teamcontinues to follow patient and will visit patient monthly and PRN.    CODE STATUS: Full code ADVANCED DIRECTIVES: Y MOST FORM: no PPS: 50%   PHYSICAL EXAM:   VITALS: Today's Vitals   05/03/20 1322  Pulse: (!) 106  Resp: 20  Temp: 97.9 F (36.6 C)  TempSrc: Temporal  SpO2: [place] Kitchen)  89%  PainSc: 8   PainLoc: Chest    LUNGS: clear to auscultation , poor inspiratory effort due to pain CARDIAC: Cor Tachy EXTREMITIES: No edema SKIN: thin/frail skin, scattered scabbed/raised areas noted on bilateral legs and back (she says they are all over her body)  NEURO: Alert and oriented x 3, depressed mood, increased generalized weakness, ambulatory w/rollator walker    (Duration of visit and documentation 90 minutes)   Daryl Eastern, RN BSN

## 2020-06-07 ENCOUNTER — Telehealth: Payer: Self-pay | Admitting: *Deleted

## 2020-06-07 NOTE — Telephone Encounter (Signed)
Sent communication to patient's son, Tressie Ellis, to schedule a palliative care home visit. Visit scheduled for 5/13@1p .

## 2020-06-10 ENCOUNTER — Other Ambulatory Visit: Payer: Medicare Other | Admitting: *Deleted

## 2020-06-10 ENCOUNTER — Other Ambulatory Visit: Payer: Self-pay

## 2020-06-10 VITALS — Resp 18

## 2020-06-10 DIAGNOSIS — Z515 Encounter for palliative care: Secondary | ICD-10-CM

## 2020-06-21 NOTE — Progress Notes (Signed)
COMMUNITY PALLIATIVE CARE RN NOTE  PATIENT NAME: Karen Calhoun DOB: 1921/03/22 MRN: [id number]  PRIMARY CARE PROVIDER: Deland Pretty, MD  RESPONSIBLE PARTY: Lavonna Monarch (son) Acct ID - Guarantor Home Phone Work Phone Relationship Acct Type  [id number] DESTENIE, [phone]  Self P/F     [address], [place], [place] [address]   Covid-19 Pre-screening Negative  PLAN OF CARE and INTERVENTION:  1. ADVANCE CARE PLANNING/GOALS OF CARE: Goal is for patient to remain in her home. She is a Full code 2. PATIENT/CAREGIVER EDUCATION: Symptom management, pain management, safe mobility 3. DISEASE STATUS: Face-to-face visit completed in patient's home. Son Tressie Ellis also present during visit. Patient is sitting up on the couch in her sitting room listening to books on tapes. She remains alert and oriented x 3 and able to engage in appropriate conversation. She is forgetful at times. She is legally blind and only able to see outlines. She continues to have some pain across her right rib cage area, from a fall about a month in a half ago. She also has some pain in her right thumb and left arm, but is able to move it better. She sustained a contusion of her ribs and closed fracture of her thumb. She has Tylenol available, but often does not take it. No shortness of breath. She has an intermittent congested cough. She says this is from her sinusitis along with post nasal drip, and is a chronic issue. She continues to smoke. Her oxygen level is 90% on room air. She has been approved for aide services through [facility]. Her aide is scheduled to come 3x/week to assist her with personal care needs. She was able to get a full shower yesterday. Her son bought her a shower bench and she feels much safer having someone there to help her. She remains ambulatory using her walker. She spends most of her day sitting or lying down. She has a poor appetite. Her family brings her meals that she can microwave herself. She is  very thin/frail. She has several dark crusted areas scattered on her legs, arms and chest. She says that there is one area that drains clear fluid occasionally. She is refusing to see her Primary Physician or Dermatologist as she does not want anyone seeing them or assessing her at all. She recently went to the dentist this week to have her teeth cleaned. She is wearing her Life alert around her neck in case of emergency. Will continue to monitor.   HISTORY OF PRESENT ILLNESS: This is a 87 yo female with a past medical history of macular degeneration, hypertension, hyperlipidemia, stress urinary incontinence and anxiety. Palliative care teamcontinues to follow patient and will visit patient monthly and PRN.    CODE STATUS: Full code ADVANCED DIRECTIVES: Y MOST FORM: no PPS: 50%   PHYSICAL EXAM:   VITALS: Today's Vitals   06/10/20 1326  Resp: 18  SpO2: 90%  PainSc: 3   PainLoc: Rib Cage    LUNGS: decreased breath sounds, intermit congested cough CARDIAC: Cor RRR EXTREMITIES: No edema SKIN: Dark, raised crusted areas scattered on her legs, arms and chest; thin/frail skin  NEURO: Alert and oriented x 3, forgetful, legally blind, generalized weakness, ambulatory w/walker   (Duration of visit and documentation 75 minutes)   Daryl Eastern, RN BSN

## 2020-07-14 ENCOUNTER — Telehealth: Payer: Self-pay | Admitting: Dermatology

## 2020-07-14 NOTE — Telephone Encounter (Signed)
Phone call to a [facility] health care pharmacy. Left message for them to call back.

## 2020-07-14 NOTE — Telephone Encounter (Signed)
They say they faxed Rx request for UV therapy. They say they haven't gotten a response

## 2020-07-23 ENCOUNTER — Emergency Department (HOSPITAL_COMMUNITY): Payer: Medicare Other

## 2020-07-23 ENCOUNTER — Emergency Department (HOSPITAL_COMMUNITY)
Admission: EM | Admit: 2020-07-23 | Discharge: 2020-07-23 | Disposition: A | Discharge status: Home / Self Care | Payer: Medicare Other | Attending: Emergency Medicine | Admitting: Emergency Medicine

## 2020-07-23 ENCOUNTER — Other Ambulatory Visit: Payer: Self-pay

## 2020-07-23 DIAGNOSIS — S81812A Laceration without foreign body, left lower leg, initial encounter: Secondary | ICD-10-CM | POA: Diagnosis not present

## 2020-07-23 DIAGNOSIS — R296 Repeated falls: Secondary | ICD-10-CM | POA: Insufficient documentation

## 2020-07-23 DIAGNOSIS — R531 Weakness: Secondary | ICD-10-CM | POA: Diagnosis not present

## 2020-07-23 DIAGNOSIS — Z79899 Other long term (current) drug therapy: Secondary | ICD-10-CM | POA: Diagnosis not present

## 2020-07-23 DIAGNOSIS — Z7982 Long term (current) use of aspirin: Secondary | ICD-10-CM | POA: Diagnosis not present

## 2020-07-23 DIAGNOSIS — I10 Essential (primary) hypertension: Secondary | ICD-10-CM | POA: Diagnosis not present

## 2020-07-23 DIAGNOSIS — G9389 Other specified disorders of brain: Secondary | ICD-10-CM | POA: Diagnosis not present

## 2020-07-23 DIAGNOSIS — W19XXXA Unspecified fall, initial encounter: Secondary | ICD-10-CM | POA: Diagnosis not present

## 2020-07-23 DIAGNOSIS — Y92009 Unspecified place in unspecified non-institutional (private) residence as the place of occurrence of the external cause: Secondary | ICD-10-CM | POA: Diagnosis not present

## 2020-07-23 DIAGNOSIS — Z043 Encounter for examination and observation following other accident: Secondary | ICD-10-CM | POA: Diagnosis not present

## 2020-07-23 DIAGNOSIS — S8992XA Unspecified injury of left lower leg, initial encounter: Secondary | ICD-10-CM | POA: Diagnosis present

## 2020-07-23 DIAGNOSIS — J984 Other disorders of lung: Secondary | ICD-10-CM | POA: Diagnosis not present

## 2020-07-23 DIAGNOSIS — F17210 Nicotine dependence, cigarettes, uncomplicated: Secondary | ICD-10-CM | POA: Insufficient documentation

## 2020-07-23 DIAGNOSIS — S0990XA Unspecified injury of head, initial encounter: Secondary | ICD-10-CM | POA: Diagnosis not present

## 2020-07-23 DIAGNOSIS — R0902 Hypoxemia: Secondary | ICD-10-CM | POA: Diagnosis not present

## 2020-07-23 LAB — CBC WITH DIFFERENTIAL/PLATELET
Abs Immature Granulocytes: 0.05 10*3/uL (ref 0.00–0.07)
Basophils Absolute: 0.1 10*3/uL (ref 0.0–0.1)
Basophils Relative: 1 %
Eosinophils Absolute: 0.1 10*3/uL (ref 0.0–0.5)
Eosinophils Relative: 1 %
HCT: 43.7 % (ref 36.0–46.0)
Hemoglobin: 13.1 g/dL (ref 12.0–15.0)
Immature Granulocytes: 1 %
Lymphocytes Relative: 14 %
Lymphs Abs: 1.4 10*3/uL (ref 0.7–4.0)
MCH: 27.3 pg (ref 26.0–34.0)
MCHC: 30 g/dL (ref 30.0–36.0)
MCV: 91 fL (ref 80.0–100.0)
Monocytes Absolute: 1.1 10*3/uL — ABNORMAL HIGH (ref 0.1–1.0)
Monocytes Relative: 11 %
Neutro Abs: 7.5 10*3/uL (ref 1.7–7.7)
Neutrophils Relative %: 72 %
Platelets: 319 10*3/uL (ref 150–400)
RBC: 4.8 MIL/uL (ref 3.87–5.11)
RDW: 16.3 % — ABNORMAL HIGH (ref 11.5–15.5)
WBC: 10.1 10*3/uL (ref 4.0–10.5)
nRBC: 0 % (ref 0.0–0.2)

## 2020-07-23 LAB — COMPREHENSIVE METABOLIC PANEL
ALT: 6 U/L (ref 0–44)
AST: 20 U/L (ref 15–41)
Albumin: 3.7 g/dL (ref 3.5–5.0)
Alkaline Phosphatase: 60 U/L (ref 38–126)
Anion gap: 9 (ref 5–15)
BUN: 28 mg/dL — ABNORMAL HIGH (ref 8–23)
CO2: 28 mmol/L (ref 22–32)
Calcium: 9.3 mg/dL (ref 8.9–10.3)
Chloride: 104 mmol/L (ref 98–111)
Creatinine, Ser: 1.13 mg/dL — ABNORMAL HIGH (ref 0.44–1.00)
GFR, Estimated: 44 mL/min — ABNORMAL LOW (ref >60)
Glucose, Bld: 104 mg/dL — ABNORMAL HIGH (ref 70–99)
Potassium: 5 mmol/L (ref 3.5–5.1)
Sodium: 141 mmol/L (ref 135–145)
Total Bilirubin: 1.4 mg/dL — ABNORMAL HIGH (ref 0.3–1.2)
Total Protein: 6.3 g/dL — ABNORMAL LOW (ref 6.5–8.1)

## 2020-07-23 MED ORDER — ACETAMINOPHEN 325 MG PO TABS: 650.0000 mg | ORAL_TABLET | Freq: Once | ORAL | Status: DC

## 2020-07-23 MED ORDER — SODIUM CHLORIDE 0.9 % IV BOLUS
500.0000 mL | Freq: Once | INTRAVENOUS | Status: AC
  Administered 2020-07-23: 500 mL via INTRAVENOUS

## 2020-07-23 NOTE — ED Provider Notes (Signed)
[facility] DEPT Provider Note   CSN: [id number] Arrival date & time: 07/23/20  1243     History No chief complaint on file.   Karen Calhoun is a 87 y.o. female.  She lives at home.  She has had 2 falls in the last 24 hours.  She is complaining of pain to her left lower leg, and has a wound of her left lower leg.  Son states she does not see very well due to macular degeneration.  Does not eat much and this is baseline for her.  Continues to smoke.  She says her breathing is at baseline.  She does not think she struck her head or passed out.  Ambulates with a walker.  The history is provided by the patient and a relative.  Fall This is a recurrent problem. The current episode started 6 to 12 hours ago. The problem has not changed since onset.Pertinent negatives include no chest pain, no abdominal pain, no headaches and no shortness of breath. The symptoms are aggravated by bending and twisting. Nothing relieves the symptoms. She has tried nothing for the symptoms. The treatment provided no relief.      Past Medical History:  Diagnosis Date   Anxiety    Complication of anesthesia    VERY HIGH ANXIOUS   History of chronic sinusitis    Hyperlipidemia    Hypertension    Legal blindness, as defined in [place]    BILATERAL    Macular degeneration    SUI (stress urinary incontinence, female)     Patient Active Problem List   Diagnosis Date Noted   Inadequate caloric intake 11/29/2016    Past Surgical History:  Procedure Laterality Date   APPENDECTOMY  age 18's   CLAVICLE SURGERY     CYSTOSCOPY MACROPLASTIQUE IMPLANT N/A 01/07/2015   Procedure: CYSTOSCOPY MACROPLASTIQUE IMPLANT;  Surgeon: Bjorn Loser, MD;  Location: [facility];  Service: Urology;  Laterality: N/A;   ORIF ANKLE FX  2006   retained hardware   TONSILLECTOMY  age 61's     OB History   No obstetric history on file.     No family history on file.  Social  History   Tobacco Use   Smoking status: Every Day    Packs/day: 1.00    Years: 72.00    Pack years: 72.00    Types: Cigarettes   Smokeless tobacco: Never  Substance Use Topics   Alcohol use: Yes    Comment: rare   Drug use: No    Home Medications Prior to Admission medications   Medication Sig Start Date End Date Taking? Authorizing Provider  acetaminophen (TYLENOL) 325 MG tablet Take 650 mg by mouth every 6 (six) hours as needed. Headache or pain    [provider]  amLODipine (NORVASC) 5 MG tablet Take 5 mg by mouth daily.    [provider]  aspirin EC 81 MG tablet Take 81 mg by mouth daily.    [provider]  atenolol (TENORMIN) 50 MG tablet Take 50 mg by mouth daily.    [provider]  atorvastatin (LIPITOR) 10 MG tablet Take 10 mg by mouth daily.    [provider]  diphenhydramine-acetaminophen (TYLENOL PM EXTRA STRENGTH) 25-500 MG TABS Take 1 tablet by mouth at bedtime as needed. sleep    [provider]  hydrALAZINE (APRESOLINE) 25 MG tablet Take 25 mg by mouth 2 (two) times daily.    [provider]  hydrochlorothiazide (HYDRODIURIL) 25 MG tablet Take 25 mg by mouth daily.    [provider]  HYDROcodone-acetaminophen (NORCO/VICODIN) 5-325 MG tablet Take 1 tablet by mouth every 6 (six) hours as needed. 05/06/20   Volanda Napoleon, PA-C  Multiple Vitamin (MULITIVITAMIN WITH MINERALS) TABS Take 1 tablet by mouth daily.    [provider]  ondansetron (ZOFRAN ODT) 4 MG disintegrating tablet Take 1 tablet (4 mg total) by mouth every 8 (eight) hours as needed for nausea or vomiting. 05/06/20   Providence Lanius A, PA-C  potassium chloride SA (K-DUR,KLOR-CON) 20 MEQ tablet Take 20 mEq by mouth daily.    [provider]  Vitamin D, Cholecalciferol, 1000 units CAPS Take by mouth.    [provider]    Allergies    Shellfish allergy, Codeine, Oxycodone hcl, Tramadol, and Sulfa  antibiotics  Review of Systems   Review of Systems  Constitutional:  Negative for fever.  HENT:  Negative for sore throat.   Eyes:  Positive for visual disturbance.  Respiratory:  Negative for shortness of breath.   Cardiovascular:  Negative for chest pain.  Gastrointestinal:  Negative for abdominal pain.  Genitourinary:  Negative for dysuria.  Musculoskeletal:  Positive for gait problem.  Skin:  Positive for wound.  Neurological:  Negative for headaches.   Physical Exam Updated Vital Signs BP (!) 165/72 (BP Location: Left Arm)   Pulse 73   Temp 97.8 F (36.6 C) (Oral)   Resp (!) 27   Ht 5' 3"  (1.6 m)   Wt 36 kg   SpO2 98%   BMI 14.06 kg/m   Physical Exam Vitals and nursing note reviewed.  Constitutional:      General: She is not in acute distress.    Appearance: She is well-developed. She is cachectic.  HENT:     Head: Normocephalic and atraumatic.  Eyes:     Conjunctiva/sclera: Conjunctivae normal.  Cardiovascular:     Rate and Rhythm: Normal rate and regular rhythm.     Heart sounds: No murmur heard. Pulmonary:     Effort: Pulmonary effort is normal. No respiratory distress.     Breath sounds: Normal breath sounds.  Abdominal:     Palpations: Abdomen is soft.     Tenderness: There is no abdominal tenderness.  Musculoskeletal:        General: Tenderness and signs of injury present. Normal range of motion.     Cervical back: Neck supple.     Comments: She is approximately 8 cm skin tear to her left lower leg.  Skin:    General: Skin is warm and dry.  Neurological:     General: No focal deficit present.     Mental Status: She is alert.     Sensory: No sensory deficit.     Motor: No weakness.  Psychiatric:        Behavior: Behavior is cooperative.    ED Results / Procedures / Treatments   Labs (all labs ordered are listed, but only abnormal results are displayed) Labs Reviewed  COMPREHENSIVE METABOLIC PANEL - Abnormal; Notable for the following  components:      Result Value   Glucose, Bld 104 (*)    BUN 28 (*)    Creatinine, Ser 1.13 (*)    Total Protein 6.3 (*)    Total Bilirubin 1.4 (*)    GFR, Estimated 44 (*)    All other components within normal limits  CBC WITH DIFFERENTIAL/PLATELET - Abnormal; Notable for the following  components:   RDW 16.3 (*)    Monocytes Absolute 1.1 (*)    All other components within normal limits  URINE CULTURE    EKG None  Radiology DG Ribs Unilateral W/Chest Left  Result Date: 07/23/2020 CLINICAL DATA:  Per EMS pt from home, pt lives at home alone, smokes 1 pack per day, has not showered in 2 weeks, lost weight, not eating or drinking. Pt fell on table due to being weak while trying to get up to use bathroom. Hx of HTN. EXAM: LEFT RIBS AND CHEST - 3+ VIEW COMPARISON:  05/06/2020 and older studies. FINDINGS: No rib fracture or rib lesion. Cardiac silhouette normal in size.  No mediastinal or hilar masses. Mild linear scarring at the left lung base. Lungs otherwise clear. No convincing pleural effusion or pneumothorax. Skeletal structures are demineralized. IMPRESSION: 1. No rib fracture or rib lesion. 2. No acute cardiopulmonary disease. Electronically Signed   By: Lajean Manes M.D.   On: 07/23/2020 14:49   DG Tibia/Fibula Left  Result Date: 07/23/2020 CLINICAL DATA:  Per EMS pt from home, pt lives at home alone, smokes 1 pack per day, has not showered in 2 weeks, lost weight, not eating or drinking. Pt fell on table due to being weak while trying to get up to use bathroom. Hx of HTN. EXAM: LEFT TIBIA AND FIBULA - 2 VIEW COMPARISON:  None. FINDINGS: No acute fracture.  No bone lesion. Old medial malleolar and distal fibular fractures have been reduced with 2 medial malleolar screws and screws and a fixation plate across the distal fibula. Orthopedic hardware is well seated. Ankle and knee joints are normally aligned. Soft tissues are unremarkable. Skeletal structures are demineralized. IMPRESSION:  1. No acute fracture or dislocation. 2. Changes from the ORIF old ankle fractures. Electronically Signed   By: Lajean Manes M.D.   On: 07/23/2020 14:47   CT Head Wo Contrast  Result Date: 07/23/2020 CLINICAL DATA:  Per EMS pt from home, pt lives at home alone, smokes 1 pack per day, has not showered in 2 weeks, lost weight, not eating or drinking. Pt fell on table due to being weak while trying to get up to use bathroom. Pt fell 6 weeks age. EMS was out last night to take her to restroom but did not want to come to hospital. Pt unable to sit up on her own or stand. Son told pt not to continue to take medications 2 months ago. EXAM: CT HEAD WITHOUT CONTRAST TECHNIQUE: Contiguous axial images were obtained from the base of the skull through the vertex without intravenous contrast. COMPARISON:  06/24/2011. FINDINGS: Brain: No evidence of acute infarction, hemorrhage, hydrocephalus, extra-axial collection or mass lesion/mass effect. Age related ventricular and sulcal enlargement. Vascular: No hyperdense vessel or unexpected calcification. Skull: Normal. Negative for fracture or focal lesion. Sinuses/Orbits: Visualized globes and orbits are unremarkable. Visualized sinuses are clear. Other: None. IMPRESSION: 1. No acute intracranial abnormalities. 2. Age related volume loss which has increased since the prior head CT. Electronically Signed   By: Lajean Manes M.D.   On: 07/23/2020 14:28    Procedures .Marland KitchenLaceration Repair  Date/Time: 07/23/2020 4:21 PM Performed by: Hayden Rasmussen, MD Authorized by: Hayden Rasmussen, MD   Consent:    Consent obtained:  Verbal   Consent given by:  Patient   Risks discussed:  Infection, pain, poor cosmetic result, poor wound healing and retained foreign body   Alternatives discussed:  No treatment and delayed treatment Anesthesia:  Anesthesia method:  None Laceration details:    Location:  Leg   Leg location:  L lower leg   Length (cm):  8 Treatment:    Area  cleansed with:  Saline   Amount of cleaning:  Standard   Irrigation solution:  Sterile saline Skin repair:    Repair method:  Steri-Strips   Number of Steri-Strips:  3 Approximation:    Approximation:  Close Repair type:    Repair type:  Simple Post-procedure details:    Dressing:  Non-adherent dressing   Procedure completion:  Tolerated well, no immediate complications   Medications Ordered in ED Medications  sodium chloride 0.9 % bolus 500 mL (0 mLs Intravenous Stopped 07/23/20 1724)    ED Course  I have reviewed the triage vital signs and the nursing notes.  Pertinent labs & imaging results that were available during my care of the patient were reviewed by me and considered in my medical decision making (see chart for details).  Clinical Course as of 07/24/20 0943  Sat Jul 23, 2020  1631 [facility] met with patient's son and gave him a list of resources for home health.  I have also put her in for face-to-face for additional resources at home.  Labs do not show any obvious indications for admission. [MB]    Clinical Course User Index [MB] Hayden Rasmussen, MD   MDM Rules/Calculators/A&P                         This patient complains of left flank pain after; this involves an extensive number of treatment Options and is a complaint that carries with it a high risk of complications and Morbidity. The differential includes fracture, contusion, skin tear, dehydration, metabolic derangement, failure to thrive  I ordered, reviewed and interpreted labs, which included CBC with normal white count normal hemoglobin, chemistries fairly normal, mild elevation of priors urinalysis ordered and not obtained prior to discharge I ordered medication IV fluids I ordered imaging studies which included chest x-ray and left rib series, left tib-fib, head CT and I independently    visualized and interpreted imaging which showed findings Additional history obtained from patient's son Previous records  obtained and reviewed her ED visits I consulted transitions of care and discussed lab and imaging findings  Critical Interventions: None  After the interventions stated above, I reevaluated the patient and found patient to be essentially at her baseline.  No indications for admission.  I have set the patient up for home health services.  Patient and son comfortable plan for discharge.  Wound care discussed.  Return Instructions discussed   Final Clinical Impression(s) / ED Diagnoses Final diagnoses:  Generalized weakness  Frequent falls  Skin tear of lower leg without complication, left, initial encounter    Rx / DC Orders ED Discharge Orders     None        Hayden Rasmussen, MD 07/24/20 [phone]

## 2020-07-23 NOTE — ED Triage Notes (Signed)
Per EMS pt from home, pt lives at home alone, smokes 1 pack per day, has not showered in 2 weeks, lost weight, not eating or drinking. Pt fell  on table due to being weak while trying to get up to use bathroom. Pt fell 6 weeks age. EMS was out last night to take her to restroom but did not want to come to hospital. Pt unable to sit up on her own or stand. Son told pt not to continue to take medications 2 months ago. CBG 133 146/82 P 92 O2 2 liters 95 % normal 90 per son without O2 R24

## 2020-07-23 NOTE — Care Management (Signed)
Set patient up with [facility], see patient instruction sheet, for PT OT and aide. She will call them this evening for set up

## 2020-07-23 NOTE — Discharge Instructions (Addendum)
You are seen in the emergency department for evaluation of injuries from a fall at home.  You had a skin tear of your left lower leg that was Steri-Stripped.  Please watch this area to look for signs of infection.  Given x-rays of your left lower leg along with chest to left ribs and a CAT scan of your head that did not show any acute findings.  We have put in a referral for home services for you.  They should contact you in the next few days.  Return to the emergency department for any worsening or concerning symptoms

## 2020-07-23 NOTE — Social Work (Signed)
CSW met with Pt and son at bedside. Pt has PCA services through [employer], contracted by DSS, but according to son, the attendant has not been coming regularly and Pt has not been receiving a bath on a consistent basis. Pt is alert and oriented and states that she only needs assistance with getting out of shower.  CSW called [facility] in an attempt to follow up but they are closed today.  CSW provided son with list of several area care agencies that have fee for service plans should family decide to seek out-of-pocket services to supplement [facility] services.

## 2020-07-25 ENCOUNTER — Other Ambulatory Visit: Payer: Medicare Other | Admitting: *Deleted

## 2020-07-25 ENCOUNTER — Other Ambulatory Visit: Payer: Self-pay

## 2020-07-25 DIAGNOSIS — Z515 Encounter for palliative care: Secondary | ICD-10-CM

## 2020-07-25 NOTE — Progress Notes (Signed)
COMMUNITY PALLIATIVE CARE RN NOTE  PATIENT NAME: Karen Calhoun DOB: [birth year] MRN: [id number]  PRIMARY CARE PROVIDER: Deland Pretty, MD  RESPONSIBLE PARTY: Lavonna Monarch (son) Acct ID - Guarantor Home Phone Work Phone Relationship Acct Type  [id number] STEVEY, [phone]  Self P/F     [address], [place], [place] [address]   Due to the COVID-19 crisis, this virtual check-in visit was done via telephone from my office and it was initiated and consent by this patient and or family.  PLAN OF CARE and INTERVENTION:  ADVANCE CARE PLANNING/GOALS OF CARE: Goal is for patient to remain in her home with increased caregiver support. Avoid hospitalizations. Comfort care only. PATIENT/CAREGIVER EDUCATION: Hospice education, symptom management DISEASE STATUS: Virtual check-in visit completed via telephone. Son is concerned that patient is becoming progressively weaker. She remains alert and oriented x 3, but is forgetful. She continues to have falls. She was recently seen in the ED on 6/25 after having 2 falls in less than 24 hours and sustaining a skin tear to her left lower extremity. This does cause patient pain. Since she has returned home, she is total care with all ADLs. Two months ago she was ambulatory using her walker. She was slow to walk, but was still able to do so. She is now unable to stand or bear much weight. Her son states that he practically has to lift her up using his weight to transfer her. She has gone from a PPS of 50% to 30%. She does have a caregiver through [facility], however this is only for 8 hours per week and is not always consistent. Her son is looking into hiring caregivers for more hours due to increased care needs. Her son is interested in hospice care. She was evaluated for hospice back in October 2021, however patient refused. Son states she is now at a point where she is more accepting of help. She continues with a very poor appetite with poor fluid intake. She  currently weighs 79 lbs with a BMI of 14. I spoke to our [place] director who gave me to ok to contact patient's PCP to obtain a verbal order for a hospice consult. I contacted Dr. Pennie Banter office and received a return call from [facility] who states that he gave the verbal order for hospice and that he agrees to be the attending or record while patient is under hospice care. I sent this information to [facility]'s referral center and sent a communication to her son to advise of this information. Our referral center will reach out to her son, Tressie Ellis, to arrange a hospice admission visit day/time. Son is Patent attorney.   HISTORY OF PRESENT ILLNESS: This is a 87 yo female with a past medical history of macular degeneration, hypertension, hyperlipidemia, stress urinary incontinence and anxiety. Palliative care team continues to follow patient and will visit patient monthly and PRN.        CODE STATUS: Full code  ADVANCED DIRECTIVES: Y MOST FORM: no PPS: 30%   (Duration of visit and documentation 45 minutes)   Daryl Eastern, RN BSN

## 2020-07-26 ENCOUNTER — Other Ambulatory Visit (HOSPITAL_COMMUNITY): Payer: Self-pay

## 2020-07-26 DIAGNOSIS — I10 Essential (primary) hypertension: Secondary | ICD-10-CM | POA: Diagnosis not present

## 2020-07-26 DIAGNOSIS — H3530 Unspecified macular degeneration: Secondary | ICD-10-CM | POA: Diagnosis not present

## 2020-07-26 DIAGNOSIS — E785 Hyperlipidemia, unspecified: Secondary | ICD-10-CM | POA: Diagnosis not present

## 2020-07-26 DIAGNOSIS — Z681 Body mass index (BMI) 19 or less, adult: Secondary | ICD-10-CM | POA: Diagnosis not present

## 2020-07-26 DIAGNOSIS — W19XXXD Unspecified fall, subsequent encounter: Secondary | ICD-10-CM | POA: Diagnosis not present

## 2020-07-26 DIAGNOSIS — S81812D Laceration without foreign body, left lower leg, subsequent encounter: Secondary | ICD-10-CM | POA: Diagnosis not present

## 2020-07-26 DIAGNOSIS — E43 Unspecified severe protein-calorie malnutrition: Secondary | ICD-10-CM | POA: Diagnosis not present

## 2020-07-26 MED ORDER — MORPHINE SULFATE (CONCENTRATE) 10 MG /0.5 ML PO SOLN
ORAL | 0 refills | Status: DC
  Filled 2020-07-26: qty 30, 3d supply, fill #0

## 2020-07-26 MED ORDER — MORPHINE SULFATE (CONCENTRATE) 10 MG /0.5 ML PO SOLN
ORAL | 0 refills | Status: AC
  Filled 2020-07-26: qty 30, 30d supply, fill #0

## 2020-07-26 MED ORDER — HALOPERIDOL 5 MG PO TABS
5.0000 mg | ORAL_TABLET | ORAL | 0 refills | Status: DC | PRN
  Filled 2020-07-26: qty 90, 15d supply, fill #0

## 2020-07-26 MED ORDER — HALOPERIDOL 10 MG PO TABS
ORAL_TABLET | ORAL | 0 refills | Status: AC
  Filled 2020-07-26: qty 135, 22d supply, fill #0

## 2020-07-26 MED ORDER — HALOPERIDOL 10 MG PO TABS
5.0000 mg | ORAL_TABLET | ORAL | 0 refills | Status: DC | PRN
  Filled 2020-07-26: qty 45, 15d supply, fill #0

## 2020-07-27 ENCOUNTER — Other Ambulatory Visit (HOSPITAL_COMMUNITY): Payer: Self-pay

## 2020-07-27 DIAGNOSIS — I10 Essential (primary) hypertension: Secondary | ICD-10-CM | POA: Diagnosis not present

## 2020-07-27 DIAGNOSIS — S81812D Laceration without foreign body, left lower leg, subsequent encounter: Secondary | ICD-10-CM | POA: Diagnosis not present

## 2020-07-27 DIAGNOSIS — E43 Unspecified severe protein-calorie malnutrition: Secondary | ICD-10-CM | POA: Diagnosis not present

## 2020-07-27 DIAGNOSIS — E785 Hyperlipidemia, unspecified: Secondary | ICD-10-CM | POA: Diagnosis not present

## 2020-07-27 DIAGNOSIS — Z681 Body mass index (BMI) 19 or less, adult: Secondary | ICD-10-CM | POA: Diagnosis not present

## 2020-07-27 DIAGNOSIS — W19XXXD Unspecified fall, subsequent encounter: Secondary | ICD-10-CM | POA: Diagnosis not present

## 2020-07-27 MED ORDER — TRAMADOL HCL 50 MG PO TABS
50.0000 mg | ORAL_TABLET | Freq: Four times a day (QID) | ORAL | 0 refills | Status: AC | PRN
  Filled 2020-07-27: qty 60, 15d supply, fill #0

## 2020-07-28 DIAGNOSIS — W19XXXD Unspecified fall, subsequent encounter: Secondary | ICD-10-CM | POA: Diagnosis not present

## 2020-07-28 DIAGNOSIS — I10 Essential (primary) hypertension: Secondary | ICD-10-CM | POA: Diagnosis not present

## 2020-07-28 DIAGNOSIS — E785 Hyperlipidemia, unspecified: Secondary | ICD-10-CM | POA: Diagnosis not present

## 2020-07-28 DIAGNOSIS — E43 Unspecified severe protein-calorie malnutrition: Secondary | ICD-10-CM | POA: Diagnosis not present

## 2020-07-28 DIAGNOSIS — S81812D Laceration without foreign body, left lower leg, subsequent encounter: Secondary | ICD-10-CM | POA: Diagnosis not present

## 2020-07-28 DIAGNOSIS — Z681 Body mass index (BMI) 19 or less, adult: Secondary | ICD-10-CM | POA: Diagnosis not present

## 2020-07-29 DIAGNOSIS — E43 Unspecified severe protein-calorie malnutrition: Secondary | ICD-10-CM | POA: Diagnosis not present

## 2020-07-29 DIAGNOSIS — S81812D Laceration without foreign body, left lower leg, subsequent encounter: Secondary | ICD-10-CM | POA: Diagnosis not present

## 2020-07-29 DIAGNOSIS — I10 Essential (primary) hypertension: Secondary | ICD-10-CM | POA: Diagnosis not present

## 2020-07-29 DIAGNOSIS — W19XXXD Unspecified fall, subsequent encounter: Secondary | ICD-10-CM | POA: Diagnosis not present

## 2020-07-29 DIAGNOSIS — E785 Hyperlipidemia, unspecified: Secondary | ICD-10-CM | POA: Diagnosis not present

## 2020-07-29 DIAGNOSIS — Z681 Body mass index (BMI) 19 or less, adult: Secondary | ICD-10-CM | POA: Diagnosis not present

## 2020-07-29 DIAGNOSIS — H3530 Unspecified macular degeneration: Secondary | ICD-10-CM | POA: Diagnosis not present

## 2020-07-30 DIAGNOSIS — E785 Hyperlipidemia, unspecified: Secondary | ICD-10-CM | POA: Diagnosis not present

## 2020-07-30 DIAGNOSIS — I10 Essential (primary) hypertension: Secondary | ICD-10-CM | POA: Diagnosis not present

## 2020-07-30 DIAGNOSIS — W19XXXD Unspecified fall, subsequent encounter: Secondary | ICD-10-CM | POA: Diagnosis not present

## 2020-07-30 DIAGNOSIS — S81812D Laceration without foreign body, left lower leg, subsequent encounter: Secondary | ICD-10-CM | POA: Diagnosis not present

## 2020-07-30 DIAGNOSIS — Z681 Body mass index (BMI) 19 or less, adult: Secondary | ICD-10-CM | POA: Diagnosis not present

## 2020-07-30 DIAGNOSIS — E43 Unspecified severe protein-calorie malnutrition: Secondary | ICD-10-CM | POA: Diagnosis not present

## 2020-08-02 DIAGNOSIS — I10 Essential (primary) hypertension: Secondary | ICD-10-CM | POA: Diagnosis not present

## 2020-08-02 DIAGNOSIS — Z681 Body mass index (BMI) 19 or less, adult: Secondary | ICD-10-CM | POA: Diagnosis not present

## 2020-08-02 DIAGNOSIS — E43 Unspecified severe protein-calorie malnutrition: Secondary | ICD-10-CM | POA: Diagnosis not present

## 2020-08-02 DIAGNOSIS — S81812D Laceration without foreign body, left lower leg, subsequent encounter: Secondary | ICD-10-CM | POA: Diagnosis not present

## 2020-08-02 DIAGNOSIS — E785 Hyperlipidemia, unspecified: Secondary | ICD-10-CM | POA: Diagnosis not present

## 2020-08-02 DIAGNOSIS — W19XXXD Unspecified fall, subsequent encounter: Secondary | ICD-10-CM | POA: Diagnosis not present

## 2020-08-29 DEATH — deceased

## 2022-10-02 ENCOUNTER — Other Ambulatory Visit (HOSPITAL_COMMUNITY): Payer: Self-pay
